# Patient Record
Sex: Female | Born: 1995
Health system: Southern US, Community
[De-identification: ages and names within clinical notes are randomized; demographics above are authoritative.]

## PROBLEM LIST (undated history)

## (undated) DIAGNOSIS — F329 Major depressive disorder, single episode, unspecified: Secondary | ICD-10-CM

## (undated) DIAGNOSIS — F419 Anxiety disorder, unspecified: Secondary | ICD-10-CM

## (undated) DIAGNOSIS — F32A Depression, unspecified: Secondary | ICD-10-CM

## (undated) DIAGNOSIS — F39 Unspecified mood [affective] disorder: Secondary | ICD-10-CM

## (undated) HISTORY — PX: ABDOMINAL HYSTERECTOMY: SHX81

## (undated) HISTORY — PX: NO PAST SURGERIES: SHX2092

---

## 2015-07-18 ENCOUNTER — Inpatient Hospital Stay (HOSPITAL_COMMUNITY)
Admission: AD | Admit: 2015-07-18 | Discharge: 2015-07-21 | DRG: 885 | Disposition: A | Discharge status: Home / Self Care | Payer: Managed Care, Other (non HMO) | Source: Intra-hospital | Attending: Psychiatry | Admitting: Psychiatry

## 2015-07-18 ENCOUNTER — Encounter (HOSPITAL_COMMUNITY): Payer: Self-pay

## 2015-07-18 ENCOUNTER — Emergency Department (HOSPITAL_BASED_OUTPATIENT_CLINIC_OR_DEPARTMENT_OTHER)
Admission: EM | Admit: 2015-07-18 | Discharge: 2015-07-18 | Disposition: A | Discharge status: Other Acute Inpatient Hospital | Payer: Managed Care, Other (non HMO) | Attending: Emergency Medicine | Admitting: Emergency Medicine

## 2015-07-18 ENCOUNTER — Encounter (HOSPITAL_BASED_OUTPATIENT_CLINIC_OR_DEPARTMENT_OTHER): Payer: Self-pay | Admitting: *Deleted

## 2015-07-18 DIAGNOSIS — Z818 Family history of other mental and behavioral disorders: Secondary | ICD-10-CM | POA: Diagnosis not present

## 2015-07-18 DIAGNOSIS — R45851 Suicidal ideations: Secondary | ICD-10-CM

## 2015-07-18 DIAGNOSIS — F1310 Sedative, hypnotic or anxiolytic abuse, uncomplicated: Secondary | ICD-10-CM | POA: Diagnosis not present

## 2015-07-18 DIAGNOSIS — F419 Anxiety disorder, unspecified: Secondary | ICD-10-CM | POA: Insufficient documentation

## 2015-07-18 DIAGNOSIS — Z79899 Other long term (current) drug therapy: Secondary | ICD-10-CM | POA: Diagnosis not present

## 2015-07-18 DIAGNOSIS — F640 Transsexualism: Secondary | ICD-10-CM | POA: Diagnosis present

## 2015-07-18 DIAGNOSIS — F649 Gender identity disorder, unspecified: Secondary | ICD-10-CM | POA: Diagnosis not present

## 2015-07-18 DIAGNOSIS — R4589 Other symptoms and signs involving emotional state: Secondary | ICD-10-CM

## 2015-07-18 DIAGNOSIS — F411 Generalized anxiety disorder: Secondary | ICD-10-CM | POA: Diagnosis present

## 2015-07-18 DIAGNOSIS — F329 Major depressive disorder, single episode, unspecified: Secondary | ICD-10-CM | POA: Diagnosis not present

## 2015-07-18 DIAGNOSIS — F332 Major depressive disorder, recurrent severe without psychotic features: Principal | ICD-10-CM | POA: Diagnosis present

## 2015-07-18 DIAGNOSIS — G4700 Insomnia, unspecified: Secondary | ICD-10-CM | POA: Diagnosis present

## 2015-07-18 DIAGNOSIS — F32A Depression, unspecified: Secondary | ICD-10-CM | POA: Diagnosis present

## 2015-07-18 HISTORY — DX: Unspecified mood (affective) disorder: F39

## 2015-07-18 HISTORY — DX: Anxiety disorder, unspecified: F41.9

## 2015-07-18 HISTORY — DX: Major depressive disorder, single episode, unspecified: F32.9

## 2015-07-18 HISTORY — DX: Depression, unspecified: F32.A

## 2015-07-18 LAB — COMPREHENSIVE METABOLIC PANEL
ALT: 10 U/L — AB (ref 14–54)
ANION GAP: 5 (ref 5–15)
AST: 18 U/L (ref 15–41)
Albumin: 4.5 g/dL (ref 3.5–5.0)
Alkaline Phosphatase: 65 U/L (ref 38–126)
BUN: 13 mg/dL (ref 6–20)
CHLORIDE: 110 mmol/L (ref 101–111)
CO2: 27 mmol/L (ref 22–32)
CREATININE: 0.74 mg/dL (ref 0.44–1.00)
Calcium: 9.1 mg/dL (ref 8.9–10.3)
Glucose, Bld: 89 mg/dL (ref 65–99)
Potassium: 4.2 mmol/L (ref 3.5–5.1)
Sodium: 142 mmol/L (ref 135–145)
Total Bilirubin: 0.2 mg/dL — ABNORMAL LOW (ref 0.3–1.2)
Total Protein: 7 g/dL (ref 6.5–8.1)

## 2015-07-18 LAB — ETHANOL: Alcohol, Ethyl (B): <5 mg/dL (ref <5)

## 2015-07-18 LAB — CBC WITH DIFFERENTIAL/PLATELET
Basophils Absolute: 0 10*3/uL (ref 0.0–0.1)
Basophils Relative: 0 %
EOS PCT: 3 %
Eosinophils Absolute: 0.1 10*3/uL (ref 0.0–0.7)
HCT: 37.3 % (ref 36.0–46.0)
Hemoglobin: 12.1 g/dL (ref 12.0–15.0)
LYMPHS ABS: 1.2 10*3/uL (ref 0.7–4.0)
LYMPHS PCT: 43 %
MCH: 27.3 pg (ref 26.0–34.0)
MCHC: 32.4 g/dL (ref 30.0–36.0)
MCV: 84.2 fL (ref 78.0–100.0)
MONO ABS: 0.3 10*3/uL (ref 0.1–1.0)
MONOS PCT: 10 %
Neutro Abs: 1.2 10*3/uL — ABNORMAL LOW (ref 1.7–7.7)
Neutrophils Relative %: 44 %
PLATELETS: 224 10*3/uL (ref 150–400)
RBC: 4.43 MIL/uL (ref 3.87–5.11)
RDW: 14 % (ref 11.5–15.5)
WBC: 2.7 10*3/uL — ABNORMAL LOW (ref 4.0–10.5)

## 2015-07-18 LAB — RAPID URINE DRUG SCREEN, HOSP PERFORMED
AMPHETAMINES: NOT DETECTED
BENZODIAZEPINES: POSITIVE — AB
Barbiturates: NOT DETECTED
Cocaine: NOT DETECTED
OPIATES: NOT DETECTED
TETRAHYDROCANNABINOL: NOT DETECTED

## 2015-07-18 LAB — URINALYSIS, ROUTINE W REFLEX MICROSCOPIC
Bilirubin Urine: NEGATIVE
GLUCOSE, UA: NEGATIVE mg/dL
Hgb urine dipstick: NEGATIVE
Ketones, ur: NEGATIVE mg/dL
LEUKOCYTES UA: NEGATIVE
Nitrite: NEGATIVE
PROTEIN: NEGATIVE mg/dL
SPECIFIC GRAVITY, URINE: 1.019 (ref 1.005–1.030)
pH: 6.5 (ref 5.0–8.0)

## 2015-07-18 LAB — SALICYLATE LEVEL: Salicylate Lvl: <4 mg/dL (ref 2.8–30.0)

## 2015-07-18 MED ORDER — BUPROPION HCL ER (SMOKING DET) 150 MG PO TB12
150.0000 mg | ORAL_TABLET | Freq: Every day | ORAL | Status: DC
  Filled 2015-07-18: qty 1

## 2015-07-18 MED ORDER — MAGNESIUM HYDROXIDE 400 MG/5ML PO SUSP
30.0000 mL | Freq: Every day | ORAL | Status: DC | PRN
  Administered 2015-07-21: 30 mL via ORAL
  Filled 2015-07-18: qty 30

## 2015-07-18 MED ORDER — ACETAMINOPHEN 325 MG PO TABS: 650.0000 mg | ORAL_TABLET | Freq: Four times a day (QID) | ORAL | Status: DC | PRN

## 2015-07-18 MED ORDER — RISPERIDONE 0.5 MG PO TABS: 0.5000 mg | ORAL_TABLET | Freq: Every day | ORAL | Status: DC

## 2015-07-18 MED ORDER — ALUM & MAG HYDROXIDE-SIMETH 200-200-20 MG/5ML PO SUSP
30.0000 mL | ORAL | Status: DC | PRN
  Administered 2015-07-20: 30 mL via ORAL
  Filled 2015-07-18: qty 30

## 2015-07-18 MED ORDER — BUPROPION HCL ER (XL) 150 MG PO TB24
150.0000 mg | ORAL_TABLET | Freq: Every day | ORAL | Status: DC
  Administered 2015-07-19: 150 mg via ORAL
  Filled 2015-07-18 (×4): qty 1

## 2015-07-18 MED ORDER — HYDROXYZINE HCL 25 MG PO TABS
25.0000 mg | ORAL_TABLET | Freq: Four times a day (QID) | ORAL | Status: DC | PRN
  Administered 2015-07-19 – 2015-07-20 (×2): 25 mg via ORAL
  Filled 2015-07-18 (×2): qty 1

## 2015-07-18 MED ORDER — CLONAZEPAM 0.5 MG PO TABS
0.5000 mg | ORAL_TABLET | Freq: Two times a day (BID) | ORAL | Status: DC | PRN
  Filled 2015-07-18: qty 1

## 2015-07-18 MED ORDER — RISPERIDONE 1 MG PO TABS
1.0000 mg | ORAL_TABLET | Freq: Every day | ORAL | Status: DC
  Administered 2015-07-18 – 2015-07-20 (×3): 1 mg via ORAL
  Filled 2015-07-18 (×7): qty 1

## 2015-07-18 NOTE — ED Notes (Signed)
Sitter called for from staffing office, patient in scrubs and belongings checked by security.

## 2015-07-18 NOTE — ED Notes (Signed)
Pt admits to SI earlier today. Plan was to take pills. Pt calm and cooperative with care. Pt identifies as a female and goes by the name Laycee.

## 2015-07-18 NOTE — Progress Notes (Signed)
Adult Psychoeducational Group Note  Date:  07/18/2015 Time:  9:29 PM  Group Topic/Focus:  Wrap-Up Group:   The focus of this group is to help patients review their daily goal of treatment and discuss progress on daily workbooks.  Participation Level:  Minimal  Participation Quality:  Appropriate  Affect:  Flat  Cognitive:  Appropriate  Insight: Appropriate  Engagement in Group:  Limited  Modes of Intervention:  Socialization and Support  Additional Comments:  Patient attended and participated in group tonight. She reports that people was nice to her today. She advised that she consider herself a creative person.  Lita Mains [facility] 07/18/2015, 9:29 PM

## 2015-07-18 NOTE — ED Provider Notes (Signed)
CSN: [id number]     Arrival date & time 07/18/15  1453 History   First MD Initiated Contact with Patient 07/18/15 1526     Chief Complaint  Patient presents with  . Suicidal     (Consider location/radiation/quality/duration/timing/severity/associated sxs/prior Treatment) HPI Patient is a 20 year old transgender female who presents with increasing suicidal ideation. He has a history of depression and anxiety for which he takes Zyban, Klonopin and Risperdal. States has been compliant with medications though occasionally takes increased doses of Klonopin for anxiety reduction. Has no previous suicidal attempts. Patient does have a history of cutting anxiety relief. Patient states he has not been able to see his mental health professional in 2 months due to insurance changes. He has had increasing thoughts of harming himself by "taking pills". Denies using illegal drugs or drinking alcohol. He also denies any auditory or visual hallucinations. No previous inpatient psychiatric treatment. Denies any physical complaints at this time. Past Medical History  Diagnosis Date  . Mood disorder (HCC)   . Anxiety    History reviewed. No pertinent past surgical history. No family history on file. Social History  Substance Use Topics  . Smoking status: Never Smoker   . Smokeless tobacco: Never Used  . Alcohol Use: No     Comment: Rare   OB History    No data available     Review of Systems  Constitutional: Negative for fever and chills.  Respiratory: Negative for shortness of breath.   Cardiovascular: Negative for chest pain.  Gastrointestinal: Negative for nausea, vomiting and abdominal pain.  Musculoskeletal: Negative for back pain and neck pain.  Skin: Positive for wound. Negative for rash.  Neurological: Negative for weakness, numbness and headaches.  Psychiatric/Behavioral: Positive for suicidal ideas, self-injury and dysphoric mood. Negative for hallucinations. The patient is  nervous/anxious.   All other systems reviewed and are negative.     Allergies  Review of patient's allergies indicates no known allergies.  Home Medications   Prior to Admission medications   Medication Sig Start Date End Date Taking? Authorizing Provider  buPROPion (ZYBAN) 150 MG 12 hr tablet Take 150 mg by mouth daily.   Yes Historical Provider, MD  clonazePAM (KLONOPIN) 0.5 MG tablet Take 0.5 mg by mouth 2 (two) times daily as needed for anxiety.   Yes Historical Provider, MD  risperiDONE (RISPERDAL) 0.5 MG tablet Take 0.5 mg by mouth at bedtime.   Yes Historical Provider, MD   LMP 07/04/2015 (Approximate) Physical Exam  Constitutional: She is oriented to person, place, and time. She appears well-developed and well-nourished. No distress.  HENT:  Head: Normocephalic and atraumatic.  Mouth/Throat: Oropharynx is clear and moist.  Eyes: EOM are normal. Pupils are equal, round, and reactive to light.  Neck: Normal range of motion. Neck supple.  Cardiovascular: Normal rate and regular rhythm.  Exam reveals no gallop and no friction rub.   No murmur heard. Pulmonary/Chest: Effort normal and breath sounds normal. No respiratory distress. She has no wheezes. She has no rales.  Abdominal: Soft. Bowel sounds are normal. She exhibits no distension and no mass. There is no tenderness. There is no rebound and no guarding.  Musculoskeletal: Normal range of motion. She exhibits no edema or tenderness.  Neurological: She is alert and oriented to person, place, and time.  Moves all extremities without deficit. Sensation is fully intact.  Skin: Skin is warm and dry. No rash noted. No erythema.  Multiple healing transverse scars on bilateral upper extremities consistent with previous  cutting. No acute injuries  Psychiatric:  Patient with dysphoric mood and poor eye contact. Good insight. Admits to suicidal ideation and possible plan for intentional overdose. No homicidal ideation.  Nursing note  and vitals reviewed.   ED Course  Procedures (including critical care time) Labs Review Labs Reviewed  CBC WITH DIFFERENTIAL/PLATELET - Abnormal; Notable for the following:    WBC 2.7 (*)    Neutro Abs 1.2 (*)    All other components within normal limits  COMPREHENSIVE METABOLIC PANEL - Abnormal; Notable for the following:    ALT 10 (*)    Total Bilirubin 0.2 (*)    All other components within normal limits  URINE RAPID DRUG SCREEN, HOSP PERFORMED - Abnormal; Notable for the following:    Benzodiazepines POSITIVE (*)    All other components within normal limits  URINALYSIS, ROUTINE W REFLEX MICROSCOPIC (NOT AT [facility])  ETHANOL  SALICYLATE LEVEL    Imaging Review No results found. I have personally reviewed and evaluated these images and lab results as part of my medical decision-making.   EKG Interpretation None      MDM   Final diagnoses:  Suicidal ideation  Depressed mood   Medically cleared for evaluation by TTS.      Loren Racer, MD 07/18/15 [phone]

## 2015-07-18 NOTE — BHH Counselor (Signed)
Per [facility]ely[facility] at [facility], pt has been accepted to bed 405-1. Writer spoke w/ Art therapist at [facility] and Asher Muir will have pt sign voluntary paperwork. Pt can be transferred before 7 pm.  Evette Cristal, [place] Therapeutic Triage Specialist

## 2015-07-18 NOTE — BH Assessment (Addendum)
Tele Assessment Note   Margaret Marshall is an 20 y.o. female. She identifies as female and goes by "Margaret Marshall". Pt reports SI with earlier plan today to overdose on pills. He states that he told a sibling and his mom of her SI and they became worried. Pt sts he used to see psychiatrist Len Blalock, but he hasn't been on meds for 2 mos due to insurance changes. He denies HI and denies [facility]. No delusions are noted. Pt reports mood which is "a little anxious" and "kind of depressed". Pt rates  anxiety as 6 on a scale of 0 to 10 (10 being most anxious). He denies substance abuse. Pt denies hx of suicide attempts. He reports a hx of self harm and last cut himself two mos ago. He reports his maternal grandfather committed suicide. Pt says that his father has bipolar d/o and ADHH and he reports his siblings have depression and anxiety. He endorses hopelessness, worthlessness, faitgue, and loss of interest in usual pleasures. Current stressors include thinking that his MI will never go away. Another stressor is feeling like he won't be able to afford the physical and MH treatment to transition from female to female. He says he worries about finding a job and states he wants to move. He says he works part time at [employer]. He says he sometimes has obsessive thoughts about stressful news stories. When asked whether upon discharge she would be able not to kill herself accidentally or on purpose, pt says, "I don't know."  Diagnosis:  Major Depressive Disorder, Recurrent, Moderate Generalized Anxiety Disorder  Past Medical History:  Past Medical History  Diagnosis Date  . Mood disorder (HCC)   . Anxiety     History reviewed. No pertinent past surgical history.  Family History: No family history on file.  Social History:  reports that she has never smoked. She has never used smokeless tobacco. She reports that she does not drink alcohol or use illicit drugs.  Additional Social History:  Alcohol / Drug Use Pain  Medications: pt denies abuse Prescriptions: pt denies abuse Over the Counter: pt denies abuse History of alcohol / drug use?: No history of alcohol / drug abuse Longest period of sobriety (when/how long): n/a  CIWA: CIWA-Ar BP: 111/68 mmHg Pulse Rate: 70 COWS:    PATIENT STRENGTHS: (choose at least two) Ability for insight Average or above average intelligence Communication skills General fund of knowledge Physical Health Supportive family/friends Work skills  Allergies: No Known Allergies  Home Medications:  (Not in a hospital admission)  OB/GYN Status:  Patient's last menstrual period was 07/04/2015 (approximate).  General Assessment Data Location of Assessment:  ([facility]) TTS Assessment: In system Is this a Tele or Face-to-Face Assessment?: Tele Assessment Is this an Initial Assessment or a Re-assessment for this encounter?: Initial Assessment Marital status: Single Living Arrangements: Parent (mom) Can pt return to current living arrangement?: Yes Admission Status: Voluntary Is patient capable of signing voluntary admission?: Yes Referral Source: Self/Family/Friend Insurance type: none listed (pt states she now has Community education officer)     Crisis Care Plan Living Arrangements: Parent (mom) Name of Psychiatrist: none Name of Therapist: none  Education Status Is patient currently in school?: No Highest grade of school patient has completed: 66 Name of school: [employer]  Risk to self with the past 6 months Suicidal Ideation: Yes-Currently Present Has patient been a risk to self within the past 6 months prior to admission? : No Is patient at risk for suicide?: Yes Suicidal  Plan?: Yes-Currently Present Has patient had any suicidal plan within the past 6 months prior to admission? : No Specify Current Suicidal Plan: to overdose on pills Access to Means: Yes Specify Access to Suicidal Means: access to pills What has been your use of drugs/alcohol within the last 12 months?:  none Previous Attempts/Gestures: No How many times?: 0 Other Self Harm Risks: none Triggers for Past Attempts:  (n/a) Intentional Self Injurious Behavior: Cutting Comment - Self Injurious Behavior: pt states she last cut two weeks ago Family Suicide History: Yes (maternal grandfather committed suicide) Recent stressful life event(s): Other (Comment), Financial Problems (worried about future, moving from [place], transitioning) Persecutory voices/beliefs?: No Depression: Yes Depression Symptoms: Feeling worthless/self pity, Loss of interest in usual pleasures, Fatigue, Despondent Substance abuse history and/or treatment for substance abuse?: No Suicide prevention information given to non-admitted patients: Not applicable  Risk to Others within the past 6 months Homicidal Ideation: No Does patient have any lifetime risk of violence toward others beyond the six months prior to admission? : No Thoughts of Harm to Others: No Current Homicidal Intent: No Current Homicidal Plan: No Access to Homicidal Means: No Identified Victim: none History of harm to others?: No Assessment of Violence: None Noted Violent Behavior Description: pt denies hx violence - is calm Does patient have access to weapons?: No Criminal Charges Pending?: No Does patient have a court date: No Is patient on probation?: No  Psychosis Hallucinations: None noted Delusions: None noted  Mental Status Report Appearance/Hygiene: Unremarkable Eye Contact: Good Motor Activity: Freedom of movement Speech: Logical/coherent, Soft Level of Consciousness: Quiet/awake, Alert Mood: Depressed, Anxious, Sad Affect: Appropriate to circumstance, Sad, Depressed Anxiety Level: Moderate Thought Processes: Relevant, Coherent Judgement: Unimpaired Orientation: Person, Place, Time, Situation Obsessive Compulsive Thoughts/Behaviors: None  Cognitive Functioning Concentration: Normal Memory: Recent Intact, Remote Intact IQ:  Average Insight: Good Impulse Control: Fair Appetite: Good Sleep: No Change Total Hours of Sleep: 10  ADLScreening [facility] Assessment Services) Patient's cognitive ability adequate to safely complete daily activities?: Yes Patient able to express need for assistance with ADLs?: Yes Independently performs ADLs?: Yes (appropriate for developmental age)  Prior Inpatient Therapy Prior Inpatient Therapy: No Prior Therapy Dates: na Prior Therapy Facilty/Provider(s): na Reason for Treatment: na  Prior Outpatient Therapy Prior Outpatient Therapy: Yes Prior Therapy Dates: until two mos ago Prior Therapy Facilty/Provider(s): Dr Len Blalock Reason for Treatment: depression, anxiety Does patient have an ACCT team?: No Does patient have Intensive In-House Services?  : No Does patient have [facility] services? : No Does patient have P4CC services?: No  ADL Screening (condition at time of admission) Patient's cognitive ability adequate to safely complete daily activities?: Yes Is the patient deaf or have difficulty hearing?: No Does the patient have difficulty seeing, even when wearing glasses/contacts?: No Does the patient have difficulty concentrating, remembering, or making decisions?: No Patient able to express need for assistance with ADLs?: Yes Does the patient have difficulty dressing or bathing?: No Independently performs ADLs?: Yes (appropriate for developmental age) Does the patient have difficulty walking or climbing stairs?: No Weakness of Legs: None Weakness of Arms/Hands: None  Home Assistive Devices/Equipment Home Assistive Devices/Equipment: Eyeglasses    Abuse/Neglect Assessment (Assessment to be complete while patient is alone) Physical Abuse: Denies Verbal Abuse: Denies Sexual Abuse: Denies Exploitation of patient/patient's resources: Denies Self-Neglect: Denies     Merchant navy officer (For Healthcare) Does patient have an advance directive?: No Would patient like  information on creating an advanced directive?: No - patient declined information  Additional Information 1:1 In Past 12 Months?: No CIRT Risk: No Elopement Risk: No Does patient have medical clearance?: No     Disposition:  Disposition Initial Assessment Completed for this Encounter: Yes Disposition of Patient: Inpatient treatment program Type of inpatient treatment program: Adult ([facility] with[facility] NP accepts to 405-1 pending medical clearance)  Betzalel Umbarger P 07/18/2015 5:59 PM

## 2015-07-18 NOTE — ED Notes (Signed)
Report called to Patty at [facility].

## 2015-07-18 NOTE — ED Notes (Signed)
Pt left via pellum . Pt's mother has all personal items.

## 2015-07-18 NOTE — ED Notes (Signed)
Going to [facility] 405-1

## 2015-07-18 NOTE — Tx Team (Signed)
Initial Interdisciplinary Treatment Plan   PATIENT STRESSORS: Medication change or noncompliance   PATIENT STRENGTHS: Ability for insight Average or above average intelligence Communication skills General fund of knowledge Supportive family/friends   PROBLEM LIST: Problem List/Patient Goals Date to be addressed Date deferred Reason deferred Estimated date of resolution  "Better way to deal with anxiety."      "Work on my depression."                                                 DISCHARGE CRITERIA:  Improved stabilization in mood, thinking, and/or behavior Need for constant or close observation no longer present  PRELIMINARY DISCHARGE PLAN: Outpatient therapy Return to previous living arrangement  PATIENT/FAMIILY INVOLVEMENT: This treatment plan has been presented to and reviewed with the patient, Margaret Marshall.  The patient and family have been given the opportunity to ask questions and make suggestions.  Wandra Mannan 07/18/2015, 9:00 PM

## 2015-07-18 NOTE — ED Notes (Signed)
[facility] called to transport to [facility], ETA 20 minutes

## 2015-07-18 NOTE — Progress Notes (Signed)
Patient alert and oriented x 4. Patient denies pain/SI/HI/AVH. Patient states, "I was feeling very depressed and talked to my brother that is how I got here." Patient cooperative with assessment. Patient signed consent paperwork. Patient states she is transgender and is female to female and wants to be called "Margaret Marshall".  Patient's mother visited with patient during visitation hours. This Clinical research associate advised patient's mother on policy on clothing and belongings that can be brought to unit for patient. Patient was oriented to unit and attended evening group.

## 2015-07-18 NOTE — Progress Notes (Signed)
Patient has bandaid on R heel.  Old cut marks on L upper and lower arm, healed.

## 2015-07-19 ENCOUNTER — Encounter (HOSPITAL_COMMUNITY): Payer: Self-pay | Admitting: Psychiatry

## 2015-07-19 DIAGNOSIS — R45851 Suicidal ideations: Secondary | ICD-10-CM

## 2015-07-19 DIAGNOSIS — F640 Transsexualism: Secondary | ICD-10-CM | POA: Diagnosis present

## 2015-07-19 DIAGNOSIS — F649 Gender identity disorder, unspecified: Secondary | ICD-10-CM

## 2015-07-19 LAB — T4, FREE: FREE T4: 1.05 ng/dL (ref 0.61–1.12)

## 2015-07-19 LAB — LIPID PANEL
CHOL/HDL RATIO: 3.6 ratio
CHOLESTEROL: 183 mg/dL (ref 0–200)
HDL: 51 mg/dL (ref >40)
LDL Cholesterol: 103 mg/dL — ABNORMAL HIGH (ref 0–99)
Triglycerides: 143 mg/dL (ref <150)
VLDL: 29 mg/dL (ref 0–40)

## 2015-07-19 LAB — TSH: TSH: 10.554 u[IU]/mL — ABNORMAL HIGH (ref 0.350–4.500)

## 2015-07-19 MED ORDER — BUPROPION HCL ER (XL) 300 MG PO TB24: 300.0000 mg | ORAL_TABLET | Freq: Every day | ORAL | Status: DC

## 2015-07-19 MED ORDER — BUPROPION HCL ER (XL) 150 MG PO TB24
150.0000 mg | ORAL_TABLET | Freq: Every day | ORAL | Status: DC
  Administered 2015-07-20 – 2015-07-21 (×2): 150 mg via ORAL
  Filled 2015-07-19 (×4): qty 1

## 2015-07-19 MED ORDER — TRAZODONE HCL 50 MG PO TABS
50.0000 mg | ORAL_TABLET | ORAL | Status: DC | PRN
  Administered 2015-07-20: 50 mg via ORAL
  Filled 2015-07-19: qty 1

## 2015-07-19 MED ORDER — CLONAZEPAM 0.5 MG PO TABS
0.5000 mg | ORAL_TABLET | Freq: Two times a day (BID) | ORAL | Status: DC | PRN
  Administered 2015-07-20: 0.5 mg via ORAL
  Filled 2015-07-19: qty 1

## 2015-07-19 NOTE — Tx Team (Signed)
Interdisciplinary Treatment Plan Update (Adult) Date: 07/19/2015   Date: 07/19/2015 10:23 AM  Progress in Treatment:  Attending groups: Yes  Participating in groups: Yes  Taking medication as prescribed: Yes  Tolerating medication: Yes  Family/Significant othe contact made: No, CSW assessing for appropriate contact Patient understands diagnosis: Yes AEB seeking help with depression  Discussing patient identified problems/goals with staff: Yes  Medical problems stabilized or resolved: Yes  Denies suicidal/homicidal ideation: Yes Patient has not harmed self or Others: Yes   New problem(s) identified: None identified at this time.   Discharge Plan or Barriers: Pt will discharge home and follow-up with outpatient resources.  Additional comments:  Patient and CSW reviewed pt's identified goals and treatment plan. Patient verbalized understanding and agreed to treatment plan. CSW reviewed [facility] "Discharge Process and Patient Involvement" Form. Pt verbalized understanding of information provided and signed form.   Reason for Continuation of Hospitalization:  Anxiety Depression Medication stabilization Suicidal ideation  Estimated length of stay: 2-3 days  Review of initial/current patient goals per problem list:   1.  Goal(s): Patient will participate in aftercare plan  Met:  Yes  Target date: 3-5 days from date of admission   As evidenced by: Patient will participate within aftercare plan AEB aftercare provider and housing plan at discharge being identified.   07/19/15: Pt will return home and follow-up with outpatient services  2.  Goal (s): Patient will exhibit decreased depressive symptoms and suicidal ideations.  Met:  Yes  Target date: 3-5 days from date of admission   As evidenced by: Patient will utilize self rating of depression at 3 or below and demonstrate decreased signs of depression or be deemed stable for discharge by MD.  07/19/15: Pt rates depression at 2/10;  denies SI  3.  Goal(s): Patient will demonstrate decreased signs and symptoms of anxiety.  Met:  Yes  Target date: 3-5 days from date of admission   As evidenced by: Patient will utilize self rating of anxiety at 3 or below and demonstrated decreased signs of anxiety, or be deemed stable for discharge by MD  07/19/15: Pt rates anxiety at 2/10  Attendees:  Patient:    Family:    Physician: Dr. Sabra Heck, MD; Dr. Shea Evans, MD  07/19/2015 10:23 AM  Nursing: Lars Pinks, RN Case manager  07/19/2015 10:23 AM  Clinical Social Worker Peri Maris, [place] 07/19/2015 10:23 AM  Other: Tilden Fossa, [place] 07/19/2015 10:23 AM  Clinical: Grayland Ormond, RN 07/19/2015 10:23 AM  Other: , RN Charge Nurse 07/19/2015 10:23 AM  Other:     Peri Maris, [place] Work [phone]

## 2015-07-19 NOTE — B[facility] Suicide Risk Assessment (Signed)
[facility] ([facility]) Admission Suicide Risk Assessment   Nursing information obtained from:  Patient Demographic factors:  Adolescent or young adult, Caucasian, Gay, lesbian, or bisexual orientation Current Mental Status:  NA Loss Factors:  NA Historical Factors:  Family history of mental illness or substance abuse Risk Reduction Factors:  Sense of responsibility to family  Total Time spent with patient: 30 minutes Principal Problem: MDD (major depressive disorder), recurrent severe, without psychosis (HCC) Diagnosis:   Patient Active Problem List   Diagnosis Date Noted  . Gender dysphoria [F64.9] 07/19/2015  . MDD (major depressive disorder), recurrent severe, without psychosis (HCC) [F33.2] 07/18/2015   Subjective Data:Pt reports feeling depressed ,having relational issues with mother , and confiding in her brother about having SI. Pt today continues to be depressed , but denies SI. Pt does report one episode of questionable hypomanic sx - which lasted for 2 days or so - however when writer asked her specific questions she did not meet criteria for manic sx. Pt denied AH/VH /sleep issues.  Continued Clinical Symptoms:  Alcohol Use Disorder Identification Test Final Score (AUDIT): 0 The "Alcohol Use Disorders Identification Test", Guidelines for Use in Primary Care, Second Edition.  World Science writer [facility]). Score between 0-7:  no or low risk or alcohol related problems. Score between 8-15:  moderate risk of alcohol related problems. Score between 16-19:  high risk of alcohol related problems. Score 20 or above:  warrants further diagnostic evaluation for alcohol dependence and treatment.   CLINICAL FACTORS:   Depression:   Hopelessness Impulsivity   Musculoskeletal: Strength & Muscle Tone: within normal limits Gait & Station: normal Patient leans: N/A  Psychiatric Specialty Exam: Review of Systems  Psychiatric/Behavioral: Positive for depression and hallucinations.  All other  systems reviewed and are negative.   Blood pressure 125/67, pulse 86, temperature 97.8 F (36.6 C), temperature source Oral, resp. rate 16, height 5' 6.5" (1.689 m), weight 55.792 kg (123 lb), last menstrual period 07/04/2015, SpO2 100 %.Body mass index is 19.56 kg/(m^2).  General Appearance: Casual  Eye Contact::  Fair  Speech:  Normal Rate  Volume:  Normal  Mood:  Depressed  Affect:  Depressed  Thought Process:  Coherent  Orientation:  Full (Time, Place, and Person)  Thought Content:  Rumination  Suicidal Thoughts:  No  Homicidal Thoughts:  No  Memory:  Immediate;   Fair Recent;   Fair Remote;   Fair  Judgement:  Fair  Insight:  Fair  Psychomotor Activity:  Normal  Concentration:  Fair  Recall:  [employer] of Knowledge:Fair  Language: Fair  Akathisia:  No  Handed:  Right  AIMS (if indicated):     Assets:  Desire for Improvement Housing Physical Health Social Support Transportation Vocational/Educational  Sleep:  Number of Hours: 6.25  Cognition: WNL  ADL's:  Intact    COGNITIVE FEATURES THAT CONTRIBUTE TO RISK:  Polarized thinking    SUICIDE RISK:   Moderate:  Frequent suicidal ideation with limited intensity, and duration, some specificity in terms of plans, no associated intent, good self-control, limited dysphoria/symptomatology, some risk factors present, and identifiable protective factors, including available and accessible social support.  PLAN OF CARE: Patient will benefit from inpatient treatment and stabilization.  Estimated length of stay is 5-7 days.  Reviewed past medical records,treatment plan.  Will continue Wellbutrin XL to 150  mg po daily. However , will be cautious about ?hypomanic sx- pt felt she might have had an episode of two days of hypomania a long time  ago - however was never officially diagnosed. Pt was on Klonopin per ehr notes - UDS positive for BZD. Will restart home dose. Pt although did not report any anxiety sx to Clinical research associate. Will  continue Risperidone 1 mg po qhs to augment the effect of Wellbutrin. Will continue Trazodone 50 mg po qhs prn for sleep. Will continue to monitor vitals ,medication compliance and treatment side effects while patient is here.  Will monitor for medical issues as well as call consult as needed.  Reviewed labs ,will order EKG for qtc- pending Hba1c, PL. TSH - elevated - but  Free T4 wnl. CSW will start working on disposition.  Patient to participate in therapeutic milieu .       I certify that inpatient services furnished can reasonably be expected to improve the patient's condition.   Lanae Federer, MD 07/19/2015, 3:46 PM
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
## 2015-07-19 NOTE — Progress Notes (Signed)
EKG completed and placed in patient's chart. Patient stated he prefers to be called "Margaret Marshall".

## 2015-07-19 NOTE — H&P (Signed)
Psychiatric Admission Assessment Adult  Patient Identification: Margaret Marshall MRN:  [id number] Date of Evaluation:  07/19/2015 Chief Complaint:  Anxiety Principal Diagnosis: MDD (major depressive disorder), recurrent severe, without psychosis (HCC) Diagnosis:   Patient Active Problem List   Diagnosis Date Noted  . MDD (major depressive disorder), recurrent severe, without psychosis (HCC) [F33.2] 07/18/2015    Priority: Medium   History of Present Illness: Margaret Marshall 20 y.o is awake, alert and oriented X3 , patient is pleasant, clam, cooperative and guarded. Reports she was frustrated with her mother on Sunday night and threaten to kill her self. Patient reports a pass history of cutting to help deal with her anxiety. Reports she has been depressed for the past 3 years. Reports she is prescribe medications for her depression and feel like they help most of the time. States her depression 8/10 day.  Denies suicidal or homicidal ideation today. Denies auditory or visual hallucination and does not appear to be responding to internal stimuli.  Reports this is her first inpatient hospitalization and is ready to be discharge. Patient report a good appetite and states she is resting well throughout the night. Patient reports she is employed at the [employer] and has a good support system. Support, encouragement and reassurance was provided.   Associated Signs/Symptoms: Depression Symptoms:  depressed mood, suicidal thoughts without plan, anxiety, (Hypo) Manic Symptoms:  Irritable Mood, Anxiety Symptoms:  Excessive Worry, Psychotic Symptoms:  Hallucinations: None PTSD Symptoms: Avoidance:  Decreased Interest/Participation Total Time spent with patient: 45 minutes  Past Psychiatric History: Depression  Risk to Self: Is patient at risk for suicide?: Yes Risk to Others:   Prior Inpatient Therapy:   Prior Outpatient Therapy:    Alcohol Screening: Patient refused Alcohol Screening Tool: Yes 1. How often  do you have a drink containing alcohol?: Never 9. Have you or someone else been injured as a result of your drinking?: No 10. Has a relative or friend or a doctor or another health worker been concerned about your drinking or suggested you cut down?: No Alcohol Use Disorder Identification Test Final Score (AUDIT): 0 Brief Intervention: AUDIT score less than 7 or less-screening does not suggest unhealthy drinking-brief intervention not indicated Substance Abuse History in the last 12 months:  Yes.   Consequences of Substance Abuse: NA Previous Psychotropic Medications: YES Psychological Evaluations: YES Past Medical History:  Past Medical History  Diagnosis Date  . Mood disorder (HCC)   . Anxiety   . Depression    History reviewed. No pertinent past surgical history. Family History: History reviewed. No pertinent family history. Family Psychiatric  History: Father; Bipolar, Mother: depression Social History:  History  Alcohol Use No    Comment: Rare     History  Drug Use No    Social History   Social History  . Marital Status: Single    Spouse Name: N/A  . Number of Children: N/A  . Years of Education: N/A   Social History Main Topics  . Smoking status: Never Smoker   . Smokeless tobacco: Never Used  . Alcohol Use: No     Comment: Rare  . Drug Use: No  . Sexual Activity: Not Currently    Birth Control/ Protection: None   Other Topics Concern  . None   Social History Narrative   Additional Social History:    Pain Medications: pt denies abuse Prescriptions: pt denies abuse Over the Counter: pt denies abuse History of alcohol / drug use?: No history of alcohol / drug  abuse Longest period of sobriety (when/how long): n/a                    Allergies:  No Known Allergies Lab Results:  Results for orders placed or performed during the hospital encounter of 07/18/15 (from the past 48 hour(s))  TSH     Status: Abnormal   Collection Time: 07/19/15  6:40 AM   Result Value Ref Range   TSH 10.554 (H) 0.350 - 4.500 uIU/mL    Comment: Performed at [facility]  Lipid panel     Status: Abnormal   Collection Time: 07/19/15  6:40 AM  Result Value Ref Range   Cholesterol 183 0 - 200 mg/dL   Triglycerides 161 <096 mg/dL   HDL 51 >04 mg/dL   Total CHOL/HDL Ratio 3.6 RATIO   VLDL 29 0 - 40 mg/dL   LDL Cholesterol 540 (H) 0 - 99 mg/dL    Comment:        Total Cholesterol/HDL:CHD Risk Coronary Heart Disease Risk Table                     Men   Women  1/2 Average Risk   3.4   3.3  Average Risk       5.0   4.4  2 X Average Risk   9.6   7.1  3 X Average Risk  23.4   11.0        Use the calculated Patient Ratio above and the CHD Risk Table to determine the patient's CHD Risk.        ATP III CLASSIFICATION (LDL):  <100     mg/dL   Optimal  981-191  mg/dL   Near or Above                    Optimal  130-159  mg/dL   Borderline  478-295  mg/dL   High  >621     mg/dL   Very High Performed at [facility]     Metabolic Disorder Labs:  No results found for: HGBA1C, MPG No results found for: PROLACTIN Lab Results  Component Value Date   CHOL 183 07/19/2015   TRIG 143 07/19/2015   HDL 51 07/19/2015   CHOLHDL 3.6 07/19/2015   VLDL 29 07/19/2015   LDLCALC 103* 07/19/2015    Current Medications: Current Facility-Administered Medications  Medication Dose Route Frequency Provider Last Rate Last Dose  . acetaminophen (TYLENOL) tablet 650 mg  650 mg Oral Q6H PRN Beau Fanny, FNP      . alum & mag hydroxide-simeth (MAALOX/MYLANTA) 200-200-20 MG/5ML suspension 30 mL  30 mL Oral Q4H PRN Beau Fanny, FNP      . buPROPion (WELLBUTRIN XL) 24 hr tablet 150 mg  150 mg Oral Daily Beau Fanny, FNP   150 mg at 07/19/15 0750  . hydrOXYzine (ATARAX/VISTARIL) tablet 25 mg  25 mg Oral Q6H PRN Beau Fanny, FNP   25 mg at 07/19/15 0749  . magnesium hydroxide (MILK OF MAGNESIA) suspension 30 mL  30 mL Oral Daily PRN Beau Fanny, FNP      . risperiDONE (RISPERDAL) tablet 1 mg  1 mg Oral QHS Beau Fanny, FNP   1 mg at 07/18/15 2145   PTA Medications: Prescriptions prior to admission  Medication Sig Dispense Refill Last Dose  . buPROPion (WELLBUTRIN XL) 150 MG 24 hr tablet Take 150 mg by mouth daily.  Past Week at Unknown time  . clonazePAM (KLONOPIN) 0.5 MG tablet Take 0.5 mg by mouth 2 (two) times daily as needed for anxiety.   Past Week at Unknown time  . risperiDONE (RISPERDAL) 0.5 MG tablet Take 0.5 mg by mouth daily.   Past Week at Unknown time    Musculoskeletal: Strength & Muscle Tone: within normal limits Gait & Station: normal Patient leans: N/A  Psychiatric Specialty Exam: Physical Exam  Vitals reviewed. Constitutional: She is oriented to person, place, and time. She appears well-developed and well-nourished.  HENT:  Head: Normocephalic.  Eyes: Pupils are equal, round, and reactive to light.  Neck: Neck supple.  Cardiovascular: Normal rate.   Musculoskeletal: Normal range of motion.  Neurological: She is alert and oriented to person, place, and time.  Skin: Skin is warm.  Psychiatric: She has a normal mood and affect. Her behavior is normal.    Review of Systems  Psychiatric/Behavioral: Positive for depression and suicidal ideas. Negative for hallucinations and substance abuse. The patient is nervous/anxious and has insomnia.   All other systems reviewed and are negative.   Blood pressure 111/77, pulse 113, temperature 97.8 F (36.6 C), temperature source Oral, resp. rate 16, height 5' 6.5" (1.689 m), weight 55.792 kg (123 lb), last menstrual period 07/04/2015, SpO2 100 %.Body mass index is 19.56 kg/(m^2).  General Appearance: Casual  Eye Contact::  Good  Speech:  Clear and Coherent  Volume:  Normal  Mood:  Anxious and Depressed  Affect:  Depressed and Flat  Thought Process:  Linear and Logical  Orientation:  Full (Time, Place, and Person)  Thought Content:  Hallucinations:  None  Suicidal Thoughts:  Yes.  without intent/plan denies at this time  Homicidal Thoughts:  No  Memory:  Immediate;   Fair Recent;   Fair Remote;   Fair  Judgement:  Fair  Insight:  Lacking  Psychomotor Activity:  Restlessness  Concentration:  Fair  Recall:  [employer] of Knowledge:Fair  Language: Fair  Akathisia:  No  Handed:  Right  AIMS (if indicated):     Assets:  Communication Skills Desire for Improvement Social Support  ADL's:  Intact  Cognition: WNL  Sleep:  Number of Hours: 6.25    Treatment Plan Summary:  Daily contact with patient to assess and evaluate symptoms and progress in treatment and Medication management Continue with Wellbutrine 150 mg, and  Risperdal   for mood stabilization. Continue with Trazodone 50 mg PO PRN for insomnia  Will continue to monitor vitals ,medication compliance and treatment side effects while patient is here.  Reviewed labs: TSH 10.55 (elevated), Order T3/T4-Pending results. Consult to Endocrine after lab results.  Consider starting levothyroxine  BAL - , UDS + positive for benzodizpines. CSW will start working on disposition.  Patient to participate in therapeutic milieu    Observation Level/Precautions:  15 minute checks  Laboratory:  CBC Chemistry Profile HbAIC UDS UA- Reviewed   Psychotherapy:  Indiviudal and group session  Medications:  Wellbutrin and resperdal  Consultations:  Psychiatry  Discharge Concerns:  Safety, stabilization, and risk of access to medication and medication stabilization   Estimated LOS:5-7 days  Other:     I certify that inpatient services furnished can reasonably be expected to improve the patient's condition.    Oneta Rack FNP- BC 1/23/201710:03 AM

## 2015-07-19 NOTE — Progress Notes (Signed)
D:  Patient's self inventory sheet, patient sleeps good, no sleep medication given.  Good appetite, normal energy level, good concentration.  Rated depression 2, denied hopeless, anxiety #4.  Denied withdrawals.  Denied SI.  Denied physical problems.  Denied physical pain.  Goal is to work on a plan to manage anxiety, and plan on when to leave.  Plans to talk to people and keep myself occupied.  No discharge plan. A:  Medications administered per MD orders.  Emotional support and encouragement given patient. R:  Denied SI and HI, contracts for safety.  Denied A/V hallucinations.  Safety maintained with 15 minute checks.

## 2015-07-19 NOTE — [facility] Suicide Risk Assessment (Signed)
[facility] INPATIENT:  Family/Significant Other Suicide Prevention Education  Suicide Prevention Education:  Education Completed; Kathe Wirick, Pt's mother [phone],  has been identified by the patient as the family member/significant other with whom the patient will be residing, and identified as the person(s) who will aid the patient in the event of a mental health crisis (suicidal ideations/suicide attempt).  With written consent from the patient, the family member/significant other has been provided the following suicide prevention education, prior to the and/or following the discharge of the patient.  The suicide prevention education provided includes the following:  Suicide risk factors  Suicide prevention and interventions  National Suicide Hotline telephone number  [facility] assessment telephone number  [facility] Emergency Assistance 911  [facility] and/or Residential Mobile Crisis Unit telephone number  Request made of family/significant other to:  Remove weapons (e.g., guns, rifles, knives), all items previously/currently identified as safety concern.    Remove drugs/medications (over-the-counter, prescriptions, illicit drugs), all items previously/currently identified as a safety concern.  The family member/significant other verbalizes understanding of the suicide prevention education information provided.  The family member/significant other agrees to remove the items of safety concern listed above.  Margaret Marshall 07/19/2015, 4:06 PM

## 2015-07-19 NOTE — B[facility] Group Notes (Signed)
[facility] LCSW Aftercare Discharge Planning Group Note  07/19/2015 8:45 AM  Participation Quality: Alert, Appropriate and Oriented  Mood/Affect: Flat  Depression Rating: 2-3  Anxiety Rating: 2-3  Thoughts of Suicide: Pt denies SI/HI  Will you contract for safety? Yes  Current AVH: Pt denies  Plan for Discharge/Comments: Pt attended discharge planning group and actively participated in group. CSW discussed suicide prevention education with the group and encouraged them to discuss discharge planning and any relevant barriers. Pt reports that she is "fine" this morning and is agreeable to referral for outpatient resources.  Transportation Means: Pt reports access to transportation  Supports: No supports mentioned at this time  Chad Cordial, LCSWA 07/19/2015 9:47 AM

## 2015-07-19 NOTE — [facility] Group Notes (Signed)
[facility] LCSW Group Therapy  07/19/2015 1:15pm  Type of Therapy:  Group Therapy vercoming Obstacles  Participation Level:  Minimal  Participation Quality:  Reserved  Affect:  Flat; Withdrawn  Cognitive:  Appropriate and Oriented  Insight:  Developing/Improving   Engagement in Therapy:  Minimal  Modes of Intervention:  Discussion, Exploration, Problem-solving and Support  Description of Group:   In this group patients will be encouraged to explore what they see as obstacles to their own wellness and recovery. They will be guided to discuss their thoughts, feelings, and behaviors related to these obstacles. The group will process together ways to cope with barriers, with attention given to specific choices patients can make. Each patient will be challenged to identify changes they are motivated to make in order to overcome their obstacles. This group will be process-oriented, with patients participating in exploration of their own experiences as well as giving and receiving support and challenge from other group members.  Summary of Patient Progress: Pt participated minimally in group, but did identify excessive worry about the future as an obstacle that he is facing.   Therapeutic Modalities:   Cognitive Behavioral Therapy Solution Focused Therapy Motivational Interviewing Relapse Prevention Therapy   Chad Cordial, LCSWA 07/19/2015 2:35 PM

## 2015-07-19 NOTE — Progress Notes (Signed)
Adult Psychoeducational Group Note  Date:  07/19/2015 Time:  10:45 PM  Group Topic/Focus:  Wrap-Up Group:   The focus of this group is to help patients review their daily goal of treatment and discuss progress on daily workbooks.  Participation Level:  Active  Participation Quality:  Appropriate  Affect:  Appropriate  Cognitive:  Alert  Insight: Appropriate  Engagement in Group:  Engaged  Modes of Intervention:  Discussion  Additional Comments:  Patient stated having a good day. Patient goal for today was to be happy and to keep a smile on her face.   Jermaine Tholl L Sagal Gayton 07/19/2015, 10:45 PM

## 2015-07-19 NOTE — Plan of Care (Signed)
Problem: Consults Goal: Anxiety Disorder Patient Education See Patient Education Module for eduction specifics.  Outcome: Progressing Nurse discussed anxiety/coping skills with patient.        

## 2015-07-20 DIAGNOSIS — F332 Major depressive disorder, recurrent severe without psychotic features: Principal | ICD-10-CM

## 2015-07-20 LAB — HEMOGLOBIN A1C
Hgb A1c MFr Bld: 5.4 % (ref 4.8–5.6)
MEAN PLASMA GLUCOSE: 108 mg/dL

## 2015-07-20 LAB — PROLACTIN: PROLACTIN: 81.5 ng/mL — AB (ref 4.8–23.3)

## 2015-07-20 NOTE — Progress Notes (Signed)
At 1720 pt c/o feeling dizzy. Writer assessed pt v/s. Vital signs stable 124/55, 97, 98.7 100%. Pt verbalized that she may be dehydrated. Writer explained to pt that her v/s are stable. Writer offered pt fluids. Writer reported symptoms to Erskine Squibb, RN., pt nurse to f/u with pt and to reassess.

## 2015-07-20 NOTE — Progress Notes (Signed)
Pt attended spiritual care group on grief and loss facilitated by chaplain Berthel Bagnall   Group opened with brief discussion and psycho-social ed around grief and loss in relationships and in relation to self - identifying life patterns, circumstances, changes that cause losses. Established group norm of speaking from own life experience. Group goal of establishing open and affirming space for members to share loss and experience with grief, normalize grief experience and provide psycho social education and grief support.     

## 2015-07-20 NOTE — Progress Notes (Addendum)
[facility] MD Progress Note  07/20/2015 3:26 PM Margaret Marshall  MRN:  [id number] Subjective:  Reports improvement /feeling better. Does not endorse medication side effects at this time. Objective : I have discussed case with treatment team ahd have met with patient. 20 year old - identifies self as trans gender female to female- requests to be called Margaret Marshall . Reports feeling better at this time , feeling less depressed, and presents with full range of affect. Currently  focusing on being discharged soon. Tolerating Wellbutrin XL trial well- denies side effects. We reviewed side effect profile to include risk of seizures - patient has no history of seizure disorder and denies any history of eating disorder. Visible on unit, going to groups, interacting appropriate with other patients . Labs TSH elevated but FT4 within normal limits . Prolactin elevated ( on Risperidone) . HgbA1C 5.4, Lipid panel unremarkable .  Principal Problem: MDD (major depressive disorder), recurrent severe, without psychosis ([place]) Diagnosis:   Patient Active Problem List   Diagnosis Date Noted  . Gender dysphoria [F64.9] 07/19/2015  . MDD (major depressive disorder), recurrent severe, without psychosis ([place]) [F33.2] 07/18/2015   Total Time spent with patient: 20 minutes   Past Medical History:  Past Medical History  Diagnosis Date  . Mood disorder ([place])   . Anxiety   . Depression    History reviewed. No pertinent past surgical history. Family History:  Family History  Problem Relation Age of Onset  . Bipolar disorder Father     Social History:  History  Alcohol Use No    Comment: Rare     History  Drug Use No    Social History   Social History  . Marital Status: Single    Spouse Name: N/A  . Number of Children: N/A  . Years of Education: N/A   Social History Main Topics  . Smoking status: Never Smoker   . Smokeless tobacco: Never Used  . Alcohol Use: No     Comment: Rare  . Drug Use: No  . Sexual  Activity: Not Currently    Birth Control/ Protection: None   Other Topics Concern  . None   Social History Narrative   Additional Social History:    Pain Medications: pt denies abuse Prescriptions: pt denies abuse Over the Counter: pt denies abuse History of alcohol / drug use?: No history of alcohol / drug abuse Longest period of sobriety (when/how long): n/a  Sleep: improved   Appetite:  Good  Current Medications: Current Facility-Administered Medications  Medication Dose Route Frequency Provider Last Rate Last Dose  . acetaminophen (TYLENOL) tablet 650 mg  650 mg Oral Q6H PRN Benjamine Mola, FNP      . alum & mag hydroxide-simeth (MAALOX/MYLANTA) 200-200-20 MG/5ML suspension 30 mL  30 mL Oral Q4H PRN Benjamine Mola, FNP      . buPROPion (WELLBUTRIN XL) 24 hr tablet 150 mg  150 mg Oral Daily Ursula Alert, MD   150 mg at 07/20/15 0801  . clonazePAM (KLONOPIN) tablet 0.5 mg  0.5 mg Oral BID PRN Ursula Alert, MD      . hydrOXYzine (ATARAX/VISTARIL) tablet 25 mg  25 mg Oral Q6H PRN Benjamine Mola, FNP   25 mg at 07/20/15 1310  . magnesium hydroxide (MILK OF MAGNESIA) suspension 30 mL  30 mL Oral Daily PRN Benjamine Mola, FNP      . risperiDONE (RISPERDAL) tablet 1 mg  1 mg Oral QHS Benjamine Mola, FNP   1  mg at 07/19/15 2111  . traZODone (DESYREL) tablet 50 mg  50 mg Oral PRN Oneta Rack, NP        Lab Results:  Results for orders placed or performed during the hospital encounter of 07/18/15 (from the past 48 hour(s))  TSH     Status: Abnormal   Collection Time: 07/19/15  6:40 AM  Result Value Ref Range   TSH 10.554 (H) 0.350 - 4.500 uIU/mL    Comment: Performed at [facility]  T4, free     Status: None   Collection Time: 07/19/15  6:40 AM  Result Value Ref Range   Free T4 1.05 0.61 - 1.12 ng/dL    Comment: Performed at [facility]  Hemoglobin A1c     Status: None   Collection Time: 07/19/15  6:40 AM  Result Value Ref Range   Hgb A1c MFr  Bld 5.4 4.8 - 5.6 %    Comment: (NOTE)         Pre-diabetes: 5.7 - 6.4         Diabetes: >6.4         Glycemic control for adults with diabetes: <7.0    Mean Plasma Glucose 108 mg/dL    Comment: (NOTE) Performed At: [facility] [address] [place], [place] [id number] Mila Homer MD [id number] Performed at [facility]   Prolactin     Status: Abnormal   Collection Time: 07/19/15  6:40 AM  Result Value Ref Range   Prolactin 81.5 (H) 4.8 - 23.3 ng/mL    Comment: (NOTE) Performed At: [facility] [address] [place], [place] [id number] Mila Homer MD [id number] Performed at [facility]   Lipid panel     Status: Abnormal   Collection Time: 07/19/15  6:40 AM  Result Value Ref Range   Cholesterol 183 0 - 200 mg/dL   Triglycerides 203 <557 mg/dL   HDL 51 >33 mg/dL   Total CHOL/HDL Ratio 3.6 RATIO   VLDL 29 0 - 40 mg/dL   LDL Cholesterol 780 (H) 0 - 99 mg/dL    Comment:        Total Cholesterol/HDL:CHD Risk Coronary Heart Disease Risk Table                     Men   Women  1/2 Average Risk   3.4   3.3  Average Risk       5.0   4.4  2 X Average Risk   9.6   7.1  3 X Average Risk  23.4   11.0        Use the calculated Patient Ratio above and the CHD Risk Table to determine the patient's CHD Risk.        ATP III CLASSIFICATION (LDL):  <100     mg/dL   Optimal  108-106  mg/dL   Near or Above                    Optimal  130-159  mg/dL   Borderline  539-908  mg/dL   High  >520     mg/dL   Very High Performed at [facility]     Physical Findings: AIMS: Facial and Oral Movements Muscles of Facial Expression: None, normal Lips and Perioral Area: None, normal Jaw: None, normal Tongue: None, normal,Extremity Movements Upper (arms, wrists, hands, fingers): None, normal Lower (legs, knees, ankles, toes): None, normal, Trunk  Movements Neck, shoulders, hips: None, normal, Overall  Severity Severity of abnormal movements (highest score from questions above): None, normal Incapacitation due to abnormal movements: None, normal Patient's awareness of abnormal movements (rate only patient's report): No Awareness, Dental Status Current problems with teeth and/or dentures?: No Does patient usually wear dentures?: No  CIWA:  CIWA-Ar Total: 1 COWS:  COWS Total Score: 2  Musculoskeletal: Strength & Muscle Tone: within normal limits Gait & Station: normal Patient leans: N/A  Psychiatric Specialty Exam: ROS denies headache, denies chest pain or SOB.   Blood pressure 109/61, pulse 110, temperature 98.3 F (36.8 C), temperature source Oral, resp. rate 20, height 5' 6.5" (1.689 m), weight 123 lb (55.792 kg), last menstrual period 07/04/2015, SpO2 100 %.Body mass index is 19.56 kg/(m^2).  General Appearance: Well Groomed  Engineer, water::  Good  Speech:  Normal Rate  Volume:  Normal  Mood:  reports feeling better and currently does not endorse severe depression- states " I am better now"  Affect:  Appropriate and smiles at times appropriately  Thought Process:  Linear  Orientation:  Full (Time, Place, and Person)  Thought Content:  denies hallucinations, no delusions   Suicidal Thoughts:  No- currently denies any suicidal or self injurious ideations   Homicidal Thoughts:  No  Memory:  recent and remote grossly intact   Judgement:  Improved   Insight:   Improving   Psychomotor Activity:  Normal  Concentration:  Good  Recall:  Good  Fund of Knowledge:Good  Language: Good  Akathisia:  No  Handed:  Right  AIMS (if indicated):     Assets:  Communication Skills Desire for Improvement Physical Health  ADL's:  Intact  Cognition: WNL  Sleep:  Number of Hours: 6.5  Assessment - at this time patient is improving compared to admission- patient reports good tolerance and response to Wellbutrin XL. Has been facing recent stressors related to decision of proceeding with gender   transition and not having financial means to do this at present, which patient states is what led to tension with mother. Patient reports now feels more optimistic, hopeful, and less concerned about these issues. States " I know it is going to take time, I just need to be patient".  As noted, currently describes improved mood, no SI. Treatment Plan Summary: Daily contact with patient to assess and evaluate symptoms and progress in treatment, Medication management, Plan inpatient treatment  and medications as below  Continue to encourage milieu , group participation to work on coping skills and symptom reduction Continue Wellbutrin XL 150 mgrs QDAY for depression  Continue Risperidone 1 mgrs QHS for mood disorder  Continue Vistaril 25 mgrs Q 6 hours PRN for anxiety as needed  Continue Trazodone 50 mgrs QHS PRN for insomnia as needed  Consider discharge soon as patient continues to improve, stabilize . Patient aware of elevated TSH- encouraged to follow up with PCP for ongoing monitoring and treatment if indicated .  Janard Culp 07/20/2015, 3:26 PM

## 2015-07-20 NOTE — [facility] Group Notes (Signed)
[facility] Group Notes:  (Nursing/MHT/Case Management/Adjunct)  Date:  07/20/2015  Time:  10:41 AM   Type of Therapy:  Psychoeducational Skills  Participation Level:  Active  Participation Quality:  Appropriate  Affect:  Appropriate  Cognitive:  Appropriate  Insight:  Appropriate  Engagement in Group:  Engaged  Modes of Intervention:  Problem-solving  Summary of Progress/Problems: Summary of Progress/Problems: Topic was on was on recovery. Pt stated recovery to her is being "happy.'    Bethann Punches 07/20/2015, 10:41 AM

## 2015-07-20 NOTE — Progress Notes (Signed)
Adult Psychoeducational Group Note  Date:  07/20/2015 Time:  9:12 PM  Group Topic/Focus:  Wrap-Up Group:   The focus of this group is to help patients review their daily goal of treatment and discuss progress on daily workbooks.  Participation Level:  Active  Participation Quality:  Appropriate  Affect:  Appropriate  Cognitive:  Alert  Insight: Appropriate  Engagement in Group:  Engaged  Modes of Intervention:  Discussion  Additional Comments:  Patient stated having a good day. Something positive that happened today was "I got to talk with the doctor about my discharge plan. I will be discharging tomorrow".  [name] L [name] 07/20/2015, 9:12 PM

## 2015-07-20 NOTE — Progress Notes (Signed)
DAR NOTE: Pt present with depression mood and flat affect today. Pt has been present in the milieu watching TV with peers. Pt attended and participated in the group. As per selft inventory, pt had a good night sleep, good appetite, normal energy and good concentration. Pt states her goal for today is to " get recommendations on therapists/psychiatrists to see." Pt's safety ensured with 15 minute and environmental checks. Pt currently denies SI/HI and A/V hallucinations. Pt verbally agrees to seek staff if SI/HI or A/VH occurs and to consult with staff before acting on these thoughts. Will continue POC.

## 2015-07-20 NOTE — Progress Notes (Signed)
D: patient alert and oriented x 4. Patient denies pain/SI/HI/AVH. Patient questioning when she can be discharged. This Clinical research associate advised patient to talk with her doctor about possible discharge.  A: Staff to monitor Q 15 mins for safety. Encouragement and support offered. Scheduled medications administered per orders. R: Patient remains safe on the unit. Patient attended group tonight. Patient visible on hte unit and interacting with peers. Patient taking administered medications.

## 2015-07-20 NOTE — [facility] Group Notes (Signed)
[facility] LCSW Group Therapy 07/20/2015 1:15 PM  Type of Therapy: Group Therapy- Feelings about Diagnosis  Participation Level: Reserved  Participation Quality:  Appropriate  Affect:  Appropriate but withdrawn  Cognitive: Alert and Oriented   Insight:  Developing   Engagement in Therapy: Developing/Improving and Engaged   Modes of Intervention: Clarification, Confrontation, Discussion, Education, Exploration, Limit-setting, Orientation, Problem-solving, Rapport Building, Dance movement psychotherapist, Socialization and Support  Description of Group:   This group will allow patients to explore their thoughts and feelings about diagnoses they have received. Patients will be guided to explore their level of understanding and acceptance of these diagnoses. Facilitator will encourage patients to process their thoughts and feelings about the reactions of others to their diagnosis, and will guide patients in identifying ways to discuss their diagnosis with significant others in their lives. This group will be process-oriented, with patients participating in exploration of their own experiences as well as giving and receiving support and challenge from other group members.  Summary of Progress/Problems:  Pt identified having a history of anxiety and depression but reports that his was the first time she has experienced symptoms that lead to hospitalization. Pt described the difficult nature of depression as it takes away motivation to be treatment compliant. Pt then describes how she "stuffs it away" and does not deal with the symptoms effectively.  Therapeutic Modalities:   Cognitive Behavioral Therapy Solution Focused Therapy Motivational Interviewing Relapse Prevention Therapy  Chad Cordial, LCSWA 07/20/2015 5:01 PM

## 2015-07-21 MED ORDER — BUPROPION HCL ER (XL) 150 MG PO TB24: 150.0000 mg | ORAL_TABLET | Freq: Every day | ORAL | Status: DC

## 2015-07-21 MED ORDER — RISPERIDONE 1 MG PO TABS: 1.0000 mg | ORAL_TABLET | Freq: Every day | ORAL | Status: DC

## 2015-07-21 MED ORDER — HYDROXYZINE HCL 25 MG PO TABS: 25.0000 mg | ORAL_TABLET | Freq: Four times a day (QID) | ORAL | Status: DC | PRN

## 2015-07-21 MED ORDER — TRAZODONE HCL 50 MG PO TABS: 50.0000 mg | ORAL_TABLET | ORAL | Status: DC | PRN

## 2015-07-21 NOTE — Tx Team (Signed)
Interdisciplinary Treatment Plan Update (Adult) Date: 07/21/2015   Date: 07/21/2015 9:43 AM  Progress in Treatment:  Attending groups: Yes  Participating in groups: Yes  Taking medication as prescribed: Yes  Tolerating medication: Yes  Family/Significant othe contact made: Yes with mother Patient understands diagnosis: Yes AEB seeking help with depression  Discussing patient identified problems/goals with staff: Yes  Medical problems stabilized or resolved: Yes  Denies suicidal/homicidal ideation: Yes Patient has not harmed self or Others: Yes   New problem(s) identified: None identified at this time.   Discharge Plan or Barriers: Pt will discharge home and follow-up with outpatient resources.  Additional comments:  Patient and CSW reviewed pt's identified goals and treatment plan. Patient verbalized understanding and agreed to treatment plan. CSW reviewed [facility] "Discharge Process and Patient Involvement" Form. Pt verbalized understanding of information provided and signed form.   Reason for Continuation of Hospitalization:  Anxiety Depression Medication stabilization Suicidal ideation  Estimated length of stay: 0 days; Pt stable for DC  Review of initial/current patient goals per problem list:   1.  Goal(s): Patient will participate in aftercare plan  Met:  Yes  Target date: 3-5 days from date of admission   As evidenced by: Patient will participate within aftercare plan AEB aftercare provider and housing plan at discharge being identified.   07/19/15: Pt will return home and follow-up with outpatient services  2.  Goal (s): Patient will exhibit decreased depressive symptoms and suicidal ideations.  Met:  Yes  Target date: 3-5 days from date of admission   As evidenced by: Patient will utilize self rating of depression at 3 or below and demonstrate decreased signs of depression or be deemed stable for discharge by MD.  07/19/15: Pt rates depression at 2/10; denies  SI  3.  Goal(s): Patient will demonstrate decreased signs and symptoms of anxiety.  Met:  Yes  Target date: 3-5 days from date of admission   As evidenced by: Patient will utilize self rating of anxiety at 3 or below and demonstrated decreased signs of anxiety, or be deemed stable for discharge by MD  07/19/15: Pt rates anxiety at 2/10  Attendees:  Patient:    Family:    Physician: Dr. Parke Poisson, MD  07/21/2015 9:43 AM  Nursing: Lars Pinks, RN Case manager  07/21/2015 9:43 AM  Clinical Social Worker Peri Maris, [place] 07/21/2015 9:43 AM  Other: Tilden Fossa, [place] 07/21/2015 9:43 AM  Clinical: Perry Mount, RN  07/21/2015 9:43 AM  Other: , RN Charge Nurse 07/21/2015 9:43 AM  Other:     Peri Maris, [place] Work [phone]

## 2015-07-21 NOTE — [facility] Suicide Risk Assessment (Signed)
[facility]   Principal Problem: MDD (major depressive disorder), recurrent severe, without psychosis ([facility]) Discharge Diagnoses:  Patient Active Problem List   Diagnosis Date Noted  . Gender dysphoria [F64.9] 07/19/2015  . MDD (major depressive disorder), recurrent severe, without psychosis ([facility]) [F33.2] 07/18/2015    Total Time spent with patient: 30 minutes  Musculoskeletal: Strength & Muscle Tone: within normal limits Gait & Station: normal Patient leans: N/A  Psychiatric Specialty Exam: ROS  Blood pressure 109/65, pulse 110, temperature 98.1 F (36.7 C), temperature source Oral, resp. rate 20, height 5' 6.5" (1.689 m), weight 123 lb (55.792 kg), last menstrual period 07/04/2015, SpO2 100 %.Body mass index is 19.56 kg/(m^2).  General Appearance: Well Groomed  Eye Contact::  Good  Speech:  Normal Rate409  Volume:  Normal  Mood:  improved and minimizes depression at this time  Affect:  Appropriate and Full Range  Thought Process:  Linear  Orientation:  Full (Time, Place, and Person)  Thought Content:  denies hallucinations, no delusions   Suicidal Thoughts:  No denies any suicidal or self injurious ideations  Homicidal Thoughts:  No  Memory:  recent and remote grossly intact   Judgement:  Other:  improved  Insight:  improved   Psychomotor Activity:  Normal  Concentration:  Good  Recall:  Good  Fund of Knowledge:Good  Language: Good  Akathisia:  Negative  Handed:  Right  AIMS (if indicated):     Assets:  Communication Skills Desire for Improvement Resilience  Sleep:  Number of Hours: 6.25  Cognition: WNL  ADL's:  Intact   Mental Status Per Nursing Assessment::   On Admission:  NA  Demographic Factors:  20 year old , single, lives with mother, identifies self as transgender- female to female   Loss Factors: Argument with mother, concerns about financial costs related to transitioning   Historical Factors: History of self cutting,  history of depression  Risk Reduction Factors:   Sense of responsibility to family, Living with another person, especially a relative and Positive social support  Continued Clinical Symptoms:  At this time patient improved, with improved mood, denies depression at present, affect appropriate and reactive, no thought disorder, no SI or HI, no psychotic symptoms, future oriented - tolerating medications well .  Cognitive Features That Contribute To Risk:  No gross cognitive deficits noted upon discharge. Is alert , attentive, and oriented x 3     Suicide Risk:  Mild:  Suicidal ideation of limited frequency, intensity, duration, and specificity.  There are no identifiable plans, no associated intent, mild dysphoria and related symptoms, good self-control (both objective and subjective assessment), few other risk factors, and identifiable protective factors, including available and accessible social support.  Follow-up Information    Follow up with [facility].   Why:  Please complete the intake form sent to your email [email or web address] to have your first therapy appointment scheduled.   Contact information:   [address] [place], [place] [address]  ph: [phone]  f: [phone]       Follow up with [facility].   Why:  Please call this office at time of discharge to provide demographic information and have your first appointment scheduled.   Contact information:   [address] [place],  [place] [address]  Phone: [phone]  Fax: [phone]      Plan Of Care/Follow-up recommendations:  Activity:  as tolerated  Diet:  Regular Tests:  NA Other:  See below  Patient  leaving unit in good spirits  Plans to return home- lives with  Mother Follow up as above  Nehemiah Massed, MD 07/21/2015, 12:55 PM

## 2015-07-21 NOTE — [facility] Group Notes (Signed)
[facility] LCSW Aftercare Discharge Planning Group Note  07/21/2015 8:45 AM  Participation Quality: Alert, Appropriate and Oriented  Mood/Affect: Appropriate  Depression Rating: 0  Anxiety Rating: 0  Thoughts of Suicide: Pt denies SI/HI  Will you contract for safety? Yes  Current AVH: Pt denies  Plan for Discharge/Comments: Pt attended discharge planning group and actively participated in group. CSW discussed suicide prevention education with the group and encouraged them to discuss discharge planning and any relevant barriers. Pt expresses that he is ready for DC today and reports that his mother will pick him up.  Transportation Means: Pt reports access to transportation  Supports: No supports mentioned at this time  Chad Cordial, LCSWA 07/21/2015 9:42 AM

## 2015-07-21 NOTE — Progress Notes (Signed)
Discharge note:  Patient received all personal belongings.  Reviewed AVS/Discharge instructions with her and she indicated understanding.  Patient will follow up with [facility] and [facility].  Patient denies SI/HI/AVH.  Patient left ambulatory with her mother.

## 2015-07-21 NOTE — Discharge Summary (Signed)
Physician Discharge Summary Note  Patient:  Margaret Marshall is an 20 y.o., female MRN:  [id number] DOB:  1996-01-27 Patient phone:  [phone] (home)  Patient address:   [address] [place] [address],  Total Time spent with patient: 30 minutes  Date of Admission:  07/18/2015 Date of Discharge: 07/21/2015  Reason for Admission:  Depression  Principal Problem: MDD (major depressive disorder), recurrent severe, without psychosis [name]) Discharge Diagnoses: Patient Active Problem List   Diagnosis Date Noted  . Gender dysphoria [F64.9] 07/19/2015  . MDD (major depressive disorder), recurrent severe, without psychosis (HCC) [F33.2] 07/18/2015    Past Psychiatric History: see above noted  Past Medical History:  Past Medical History  Diagnosis Date  . Mood disorder (HCC)   . Anxiety   . Depression    History reviewed. No pertinent past surgical history. Family History:  Family History  Problem Relation Age of Onset  . Bipolar disorder Father    Family Psychiatric  History:  See above noted Social History:  History  Alcohol Use No    Comment: Rare     History  Drug Use No    Social History   Social History  . Marital Status: Single    Spouse Name: N/A  . Number of Children: N/A  . Years of Education: N/A   Social History Main Topics  . Smoking status: Never Smoker   . Smokeless tobacco: Never Used  . Alcohol Use: No     Comment: Rare  . Drug Use: No  . Sexual Activity: Not Currently    Birth Control/ Protection: None   Other Topics Concern  . None   Social History Narrative    Hospital Course:  Margaret Marshall was admitted for MDD (major depressive disorder), recurrent severe, without psychosis (HCC) and crisis management.  He was treated with the following medications as listed below.  Margaret Marshall was discharged with current medication and was instructed on how to take medications as prescribed; (details listed below under Medication List).  Medical problems  were identified and treated as needed.  Home medications were restarted as appropriate.  Improvement was monitored by observation and Margaret Marshall daily report of symptom reduction.  Emotional and mental status was monitored by daily self-inventory reports completed by Margaret Marshall and clinical staff.         Margaret Marshall was evaluated by the treatment team for stability and plans for continued recovery upon discharge.  Margaret Marshall motivation was an integral factor for scheduling further treatment.  Employment, transportation, bed availability, health status, family support, and any pending legal issues were also considered during his hospital stay.  He was offered further treatment options upon discharge including but not limited to Residential, Intensive Outpatient, and Outpatient treatment.  Margaret Marshall will follow up with the services as listed below under Follow Up Information.     Upon completion of this admission the Margaret Marshall was both mentally and medically stable for discharge denying suicidal/homicidal ideation, auditory/visual/tactile hallucinations, delusional thoughts and paranoia.     Physical Findings: AIMS: Facial and Oral Movements Muscles of Facial Expression: None, normal Lips and Perioral Area: None, normal Jaw: None, normal Tongue: None, normal,Extremity Movements Upper (arms, wrists, hands, fingers): None, normal Lower (legs, knees, ankles, toes): None, normal, Trunk Movements Neck, shoulders, hips: None, normal, Overall Severity Severity of abnormal movements (highest score from questions above): None, normal Incapacitation due to abnormal movements: None, normal Patient's awareness of abnormal movements (rate only patient's report): No Awareness, Dental  Status Current problems with teeth and/or dentures?: No Does patient usually wear dentures?: No  CIWA:  CIWA-Ar Total: 1 COWS:  COWS Total Score: 2  Musculoskeletal: Strength & Muscle Tone: within normal  limits Gait & Station: normal Patient leans: N/A  Psychiatric Specialty Exam:  See MD SRA Review of Systems  All other systems reviewed and are negative.   Blood pressure 109/65, pulse 110, temperature 98.1 F (36.7 C), temperature source Oral, resp. rate 20, height 5' 6.5" (1.689 m), weight 55.792 kg (123 lb), last menstrual period 07/04/2015, SpO2 100 %.Body mass index is 19.56 kg/(m^2).  Have you used any form of tobacco in the last 30 days? (Cigarettes, Smokeless Tobacco, Cigars, and/or Pipes): No  Has this patient used any form of tobacco in the last 30 days? (Cigarettes, Smokeless Tobacco, Cigars, and/or Pipes) Yes, offered  Metabolic Disorder Labs:  Lab Results  Component Value Date   HGBA1C 5.4 07/19/2015   MPG 108 07/19/2015   Lab Results  Component Value Date   PROLACTIN 81.5* 07/19/2015   Lab Results  Component Value Date   CHOL 183 07/19/2015   TRIG 143 07/19/2015   HDL 51 07/19/2015   CHOLHDL 3.6 07/19/2015   VLDL 29 07/19/2015   LDLCALC 103* 07/19/2015    See Psychiatric Specialty Exam and Suicide Risk Assessment completed by Attending Physician prior to discharge.  Discharge destination:  Home  Is patient on multiple antipsychotic therapies at discharge:  No   Has Patient had three or more failed trials of antipsychotic monotherapy by history:  No  Recommended Plan for Multiple Antipsychotic Therapies: NA     Medication List    STOP taking these medications        clonazePAM 0.5 MG tablet  Commonly known as:  KLONOPIN     lamoTRIgine 100 MG tablet  Commonly known as:  LAMICTAL      TAKE these medications      Indication   buPROPion 150 MG 24 hr tablet  Commonly known as:  WELLBUTRIN XL  Take 1 tablet (150 mg total) by mouth daily.   Indication:  Major Depressive Disorder     hydrOXYzine 25 MG tablet  Commonly known as:  ATARAX/VISTARIL  Take 1 tablet (25 mg total) by mouth every 6 (six) hours as needed for anxiety.   Indication:   Anxiety Neurosis     risperiDONE 1 MG tablet  Commonly known as:  RISPERDAL  Take 1 tablet (1 mg total) by mouth at bedtime.   Indication:  mood stabilization     traZODone 50 MG tablet  Commonly known as:  DESYREL  Take 1 tablet (50 mg total) by mouth as needed for sleep.   Indication:  Trouble Sleeping           Follow-up Information    Follow up with [facility].   Why:  Please complete the intake form sent to your email [email or web address] to have your first therapy appointment scheduled.   Contact information:   [address] [place], [place] [address]  ph: [phone]  f: [phone]       Follow up with [facility].   Why:  Please call this office at time of discharge to provide demographic information and have your first appointment scheduled.   Contact information:   [address] [place],  [place] [address]  Phone: [phone]  Fax: [phone]      Follow-up recommendations:  Activity:  as tol Diet:  as tol  Comments:  1.  Take all your medications as prescribed.              2.  Report any adverse side effects to outpatient provider.                       3.  Patient instructed to not use alcohol or illegal drugs while on prescription medicines.            4.  In the event of worsening symptoms, instructed patient to call 911, the crisis hotline or go to nearest emergency room for evaluation of symptoms.  Signed: Velna Hatchet May Agustin AGNP-BC 07/21/2015, 10:21 AM   Patient seen, Suicide Assessment Completed.  Disposition Plan Reviewed

## 2015-07-21 NOTE — Progress Notes (Signed)
D-  Patient has been in a pleasant mood.  Patient stated he had a bit of anxiety due to discharge but it was nothing that wasn't manageable.  Patient denies SI, HI, and AVH.  A- Assess patient for safety, help patient prepare for impending discharge, offer medications as prescribed.  R- Patient was able to contract for safety.

## 2015-07-21 NOTE — Progress Notes (Signed)
  [facility] Adult Case Management Discharge Plan :  Will you be returning to the same living situation after discharge:  Yes,  Pt returning home At discharge, do you have transportation home?: Yes,  mother to pick up Do you have the ability to pay for your medications: Yes,  Pt provided with 3-day supply and prescriptions  Release of information consent forms completed and in the chart;  Patient's signature needed at discharge.  Patient to Follow up at: Follow-up Information    Follow up with [facility].   Why:  Please complete the intake form sent to your email [email or web address] to have your first therapy appointment scheduled.   Contact information:   [address] [place], [place] [address]  ph: [phone]  f: [phone]       Follow up with [facility].   Why:  Please call this office at time of discharge to provide demographic information and have your first appointment scheduled.   Contact information:   [address] [place],  [place] [address]  Phone: [phone]  Fax: [phone]      Next level of care provider has access to [facility]:no  Safety Planning and Suicide Prevention discussed: Yes,  with mother; see SPE note for further details  Have you used any form of tobacco in the last 30 days? (Cigarettes, Smokeless Tobacco, Cigars, and/or Pipes): No  Has patient been referred to the Quitline?: N/A patient is not a smoker  Patient has been referred for addiction treatment: N/A  Elaina Hoops 07/21/2015, 9:59 AM

## 2015-07-21 NOTE — Progress Notes (Signed)
D. Pt had been up and visible in milieu this evening, did attend and participate in evening group activity. Pt did endorse anxiety this evening and spoke about how she was feeling anxious in part because she was getting discharged in the morning. Pt spoke about how she is feeing better since when she came in and spoke about how she feels ready for discharge. A. Support and encouragement provided. R. Safety maintained, will continue to monitor.

## 2015-07-29 ENCOUNTER — Ambulatory Visit (HOSPITAL_COMMUNITY): Payer: Self-pay | Admitting: Psychiatry

## 2015-08-03 ENCOUNTER — Encounter (HOSPITAL_COMMUNITY): Payer: Self-pay | Admitting: Psychiatry

## 2015-08-03 ENCOUNTER — Ambulatory Visit (INDEPENDENT_AMBULATORY_CARE_PROVIDER_SITE_OTHER): Payer: Managed Care, Other (non HMO) | Admitting: Psychiatry

## 2015-08-03 VITALS — BP 120/78 | HR 76 | Ht 66.25 in | Wt 124.0 lb

## 2015-08-03 DIAGNOSIS — F649 Gender identity disorder, unspecified: Secondary | ICD-10-CM

## 2015-08-03 DIAGNOSIS — F332 Major depressive disorder, recurrent severe without psychotic features: Secondary | ICD-10-CM

## 2015-08-03 DIAGNOSIS — F411 Generalized anxiety disorder: Secondary | ICD-10-CM | POA: Diagnosis not present

## 2015-08-03 MED ORDER — ARIPIPRAZOLE 5 MG PO TABS: 10.0000 mg | ORAL_TABLET | Freq: Every day | ORAL | Status: DC

## 2015-08-03 MED ORDER — BUPROPION HCL ER (XL) 300 MG PO TB24: 300.0000 mg | ORAL_TABLET | Freq: Every day | ORAL | Status: DC

## 2015-08-03 NOTE — Progress Notes (Signed)
Psychiatric Initial Adult Assessment   Patient Identification: Margaret Marshall MRN:  [id number] Date of Evaluation:  08/03/2015 Referral Source: [facility] H inpatient unit Chief Complaint:   depression and anxiety Visit Diagnosis:    ICD-9-CM ICD-10-CM   1. MDD (major depressive disorder), recurrent severe, without psychosis (HCC) 296.33 F33.2 CBC with Differential/Platelet     Comprehensive metabolic panel     Lipid panel     T4     TSH     Prolactin  2. GAD (generalized anxiety disorder) 300.02 F41.1 CBC with Differential/Platelet     Comprehensive metabolic panel     Lipid panel     T4     TSH     Prolactin  3. Gender dysphoria 302.6 F64.9 CBC with Differential/Platelet     Comprehensive metabolic panel     Lipid panel     T4     TSH     Prolactin   Diagnosis:   Patient Active Problem List   Diagnosis Date Noted  . GAD (generalized anxiety disorder) [F41.1] 08/03/2015    Priority: High  . Gender dysphoria [F64.9] 07/19/2015  . MDD (major depressive disorder), recurrent severe, without psychosis (HCC) [F33.2] 07/18/2015   History of Present Illness:  20 year old white female transferred from [facility] H inpatient adult unit(for outpatient care. Patient had been hospitalized on the [facility] H inpatient unit from 120-17 to 07/21/2015. Patient had become depressed and had suicidal ideation with a plan to overdose on her medications at that time which included Wellbutrin, Klonopin, Risperdal and Lamictal.  Patient stated she had multiple stressors which include gender dysphoria patient is a female that wants to change into a female and cannot afford the treatment. She is very stressed about this also has a history of cutting had been feeling hopeless and helpless prior to admission. In addition to that she works part-time at [employer] and lives with her mother and the parents are divorced and she used to be on her father's insurance and dad lost his job and so now she is on her [employer].  On the inpatient unit her Lamictal and Klonopin were discontinued. She was continued on Wellbutrin XL 150 mg daily, Risperdal is 1 mg daily at bedtime and was put on Vistaril 25 mg every 6 hours and trazodone 50 mg daily at bedtime.  Elevated labs at that time included high prolactin and a high TSH.  Since discharge from the hospital patient states that her sleep is good appetite tends to fluctuate she snacks a lot, mood is improving she still anxious and they're in the process of tapering the Klonopin and discontinuing it she is presently on Klonopin 0.25 mg for another week and then will discontinue it. She is also being tapered off the Risperdal she is currently on Risperdal 0.5 mg daily at bedtime for 1 week and then this will decrease to 0.25 mg for another week and discontinued. Patient is experienced no difficulties with the taper.  States that she is in a relationship, has never been sexually active and her last menstrual period was several months ago.    Associated Signs/Symptoms: Depression Symptoms:  depressed mood, psychomotor retardation, fatigue, feelings of worthlessness/guilt, anxiety, disturbed sleep, decreased appetite, (Hypo) Manic Symptoms:  Irritable Mood, Anxiety Symptoms:  Excessive Worry, Psychotic Symptoms:  None PTSD Symptoms: NA Previous Psychotropic Medications: Yes   Substance Abuse History in the last 12 months:  No.  Consequences of Substance Abuse: NA  Past psychiatric history-patient was hospitalized on  [facility] H inpatient unit from 07/18/2015 through 07/21/2015.                                           She has seen Dr. Toni Arthurs on an outpatient basis and has also seen a therapist keep Margaret Marshall.   Past Medical History: As listed Past Medical History  Diagnosis Date  . Mood disorder (HCC)   . Anxiety   . Depression    No past surgical history on file.    Family History: Dad has ADHD and bipolar disorder., 3 siblings have depression  and anxiety, maternal grandfather has alcohol problems, maternal uncle has drug problems. Family History  Problem Relation Age of Onset  . Bipolar disorder Father    Social History:  Patient currently lives with her mother Social History   Social History  . Marital Status: Single    Spouse Name: N/A  . Number of Children: N/A  . Years of Education: N/A   Social History Main Topics  . Smoking status: Never Smoker   . Smokeless tobacco: Never Used  . Alcohol Use: No     Comment: Rare  . Drug Use: No  . Sexual Activity: Not Currently    Birth Control/ Protection: None   Other Topics Concern  . None   Social History Narrative   Additional Social History: She was born and raised in [place] states her schooling was good middle school was good but high school was difficult and then after high school she came out and decided to change her 6. Worries about her inability to pay for the treatment.  Musculoskeletal: Strength & Muscle Tone: within normal limits Gait & Station: normal Patient leans: stands straight  Psychiatric Specialty Exam: HPI  Review of Systems  Psychiatric/Behavioral: Positive for depression. The patient is nervous/anxious.   All other systems reviewed and are negative.   Blood pressure 120/78, pulse 76, height 5' 6.25" (1.683 m), weight 124 lb (56.246 kg), last menstrual period 07/04/2015.Body mass index is 19.86 kg/(m^2).  General Appearance: Casual  Eye Contact:  Good  Speech:  Clear and Coherent and Normal Rate  Volume:  Normal  Mood:  Anxious, Depressed and Dysphoric  Affect:  Constricted and Depressed  Thought Process:  Goal Directed, Linear and Logical  Orientation:  Full (Time, Place, and Person)  Thought Content:  Rumination  Suicidal Thoughts:  No  Homicidal Thoughts:  No  Memory:  Immediate;   Good Recent;   Good Remote;   Good  Judgement:  Intact  Insight:  Present  Psychomotor Activity:  Normal  Concentration:  Fair  Recall:  Good   Fund of Knowledge:Good  Language: Good  Akathisia:  No  Handed:  Right  AIMS (if indicated):  0  Assets:  Communication Skills Desire for Improvement Financial Resources/Insurance Housing Physical Health Resilience Social Support  ADL's:  Intact  Cognition: WNL  Sleep:  Fair    Is the patient at risk to self?  No. Has the patient been a risk to self in the past 6 months?  Yes.   Has the patient been a risk to self within the distant past?  No. Is the patient a risk to others?  No. Has the patient been a risk to others in the past 6 months?  No. Has the patient been a risk to others within the distant past?  No.  Allergies:  No Known Allergies Current Medications: Current Outpatient Prescriptions  Medication Sig Dispense Refill  . ARIPiprazole (ABILIFY) 5 MG tablet Take 2 tablets (10 mg total) by mouth daily. 30 tablet 2  . buPROPion (WELLBUTRIN XL) 300 MG 24 hr tablet Take 1 tablet (300 mg total) by mouth daily. 30 tablet 2   No current facility-administered medications for this visit.      Medical Decision Making:  Review of Psycho-Social Stressors (1), Review or order clinical lab tests (1), Review and summation of old records (2), New Problem, with no additional work-up planned (3), Review of Medication Regimen & Side Effects (2) and Review of New Medication or Change in Dosage (2)  Treatment Plan Summary: Medication management #1 Maj. depressive disorder severe without psychotic features. Increase Wellbutrin XL 300 mg by mouth every morning. Discussed rationale risks benefits options of Abilify with the patient and obtained informed consent she'll start Abilify 5 mg by mouth every at bedtime a coupon has been given. Decrease Risperdal 0.25 mg for one week then discontinue it. #2 generalized anxiety disorder Taper and DC Klonopin she is currently on 0.25 mg daily for 1 week and she'll decrease it to 0.125 for another week and DC. Relaxation techniques and deep  breathing was discussed in detail with the patient. DC Vistaril) as patient is noncompliant. DC trazodone as patient is noncompliant. #3 gender dysphoria Patient will continue with her therapist regarding this. #4 labs Will repeat CBC, CMP, TSH T4 prolactin level and a lipid panel. #5 therapy She'll continue with Celeste neck nettles at [facility]. #6 she'll return to see me in the clinic in 2 weeks. This was a 60 minute visit it was an initial assessment of high-intensity more than 50% of the time was spent in counseling and care coordination. Diagnoses and medications were discussed in detail. Cognitive behavior therapy and identification of triggers for her anxiety was discussed interpersonal and supportive therapy was provided.   Margit Banda 2/7/20172:06 PM

## 2015-08-09 ENCOUNTER — Telehealth (HOSPITAL_COMMUNITY): Payer: Self-pay

## 2015-08-09 DIAGNOSIS — F332 Major depressive disorder, recurrent severe without psychotic features: Secondary | ICD-10-CM

## 2015-08-09 MED ORDER — ARIPIPRAZOLE 10 MG PO TABS: 5.0000 mg | ORAL_TABLET | Freq: Every day | ORAL | Status: DC

## 2015-08-09 NOTE — Telephone Encounter (Signed)
Met with Dr. Salem Senate to discuss a prior authorization request we had received for patient's newly ordered Abilify 5 mg, 2 a day due to quantity limit exceeded.  Discussed with Dr. Salem Senate from review of note 08/03/15 was patient to be getting a total of 10 mg daily or 5 mg and agreed to call in a new order changed to Abilify 10 mg, 1/2 tablet a day as patient is returning on 08/17/15 and Dr. Salem Senate reported plan to go up to 10 mg daily at that time if needed.  Called patient to inform of change as patient has not taken any Abilify as of this date yet but is tapering off Risperdal.  Patient agreed to pick up new order this date and to take 1/2 a tablet of 10 mg Abilify until returns to see Dr. Salem Senate.  Called in order to [facility] in [place] with [place], pharmacist and they will contact us back if any problems filling new order as authorized Abilify 10 mg, 1/2 daily, #15 with no refills.  Patient to return on 08/17/15 and to call if any problems with change prior to that appointment.

## 2015-08-13 ENCOUNTER — Other Ambulatory Visit (HOSPITAL_COMMUNITY): Payer: Self-pay | Admitting: Psychiatry

## 2015-08-13 LAB — COMPREHENSIVE METABOLIC PANEL
ALBUMIN: 4.7 g/dL (ref 3.6–5.1)
ALT: 7 U/L (ref 5–32)
AST: 14 U/L (ref 12–32)
Alkaline Phosphatase: 72 U/L (ref 47–176)
BILIRUBIN TOTAL: 0.4 mg/dL (ref 0.2–1.1)
BUN: 10 mg/dL (ref 7–20)
CALCIUM: 9.6 mg/dL (ref 8.9–10.4)
CO2: 25 mmol/L (ref 20–31)
Chloride: 106 mmol/L (ref 98–110)
Creat: 0.86 mg/dL (ref 0.50–1.00)
GLUCOSE: 86 mg/dL (ref 65–99)
POTASSIUM: 4.3 mmol/L (ref 3.8–5.1)
Sodium: 139 mmol/L (ref 135–146)
Total Protein: 7.2 g/dL (ref 6.3–8.2)

## 2015-08-13 LAB — CBC WITH DIFFERENTIAL/PLATELET
Basophils Absolute: 0 10*3/uL (ref 0.0–0.1)
Basophils Relative: 1 % (ref 0–1)
EOS PCT: 4 % (ref 0–5)
Eosinophils Absolute: 0.2 10*3/uL (ref 0.0–0.7)
HEMATOCRIT: 39.5 % (ref 36.0–46.0)
HEMOGLOBIN: 12.8 g/dL (ref 12.0–15.0)
LYMPHS ABS: 2.1 10*3/uL (ref 0.7–4.0)
LYMPHS PCT: 45 % (ref 12–46)
MCH: 27.1 pg (ref 26.0–34.0)
MCHC: 32.4 g/dL (ref 30.0–36.0)
MCV: 83.5 fL (ref 78.0–100.0)
MONO ABS: 0.4 10*3/uL (ref 0.1–1.0)
MONOS PCT: 9 % (ref 3–12)
MPV: 10.1 fL (ref 8.6–12.4)
NEUTROS ABS: 1.9 10*3/uL (ref 1.7–7.7)
Neutrophils Relative %: 41 % — ABNORMAL LOW (ref 43–77)
Platelets: 281 10*3/uL (ref 150–400)
RBC: 4.73 MIL/uL (ref 3.87–5.11)
RDW: 14.9 % (ref 11.5–15.5)
WBC: 4.6 10*3/uL (ref 4.0–10.5)

## 2015-08-13 LAB — T4: T4 TOTAL: 7.5 ug/dL (ref 4.5–12.0)

## 2015-08-13 LAB — LIPID PANEL
CHOL/HDL RATIO: 3.3 ratio (ref <=5.0)
Cholesterol: 186 mg/dL — ABNORMAL HIGH (ref 125–170)
HDL: 56 mg/dL (ref 36–76)
LDL CALC: 115 mg/dL — AB (ref <110)
TRIGLYCERIDES: 74 mg/dL (ref 40–136)
VLDL: 15 mg/dL (ref <30)

## 2015-08-13 LAB — TSH: TSH: 2.42 mIU/L (ref 0.50–4.30)

## 2015-08-14 LAB — PROLACTIN: Prolactin: 15.6 ng/mL

## 2015-08-17 ENCOUNTER — Ambulatory Visit (INDEPENDENT_AMBULATORY_CARE_PROVIDER_SITE_OTHER): Payer: Managed Care, Other (non HMO) | Admitting: Psychiatry

## 2015-08-17 ENCOUNTER — Encounter (HOSPITAL_COMMUNITY): Payer: Self-pay | Admitting: Psychiatry

## 2015-08-17 VITALS — BP 115/73 | HR 80 | Ht 67.0 in | Wt 120.6 lb

## 2015-08-17 DIAGNOSIS — F332 Major depressive disorder, recurrent severe without psychotic features: Secondary | ICD-10-CM | POA: Diagnosis not present

## 2015-08-17 DIAGNOSIS — F649 Gender identity disorder, unspecified: Secondary | ICD-10-CM

## 2015-08-17 DIAGNOSIS — F411 Generalized anxiety disorder: Secondary | ICD-10-CM | POA: Diagnosis not present

## 2015-08-17 MED ORDER — ARIPIPRAZOLE 10 MG PO TABS: 5.0000 mg | ORAL_TABLET | Freq: Every day | ORAL | Status: DC

## 2015-08-17 NOTE — Progress Notes (Signed)
BH H progress note  Patient Identification: Margaret Marshall MRN:  [id number] Date of Evaluation:  08/17/2015  Subjective- I'm doing okay  Visit Diagnosis:    ICD-9-CM ICD-10-CM   1. GAD (generalized anxiety disorder) 300.02 F41.1   2. Gender dysphoria 302.6 F64.9   3. MDD (major depressive disorder), recurrent severe, without psychosis (HCC) 296.33 F33.2    Diagnosis:   Patient Active Problem List   Diagnosis Date Noted  . GAD (generalized anxiety disorder) [F41.1] 08/03/2015    Priority: High  . Gender dysphoria [F64.9] 07/19/2015  . MDD (major depressive disorder), recurrent severe, without psychosis (HCC) [F33.2] 07/18/2015   History of Present Illness: -- patient was seen today along with her mother initially with patient's permission later alone. Patient states she had no difficulty getting off of the Risperdal which they did at her last visit. Patient is started the Abilify about a week ago and is tolerating it well. Discussed with her that her prolactin has decreased to 15 and she is happy about that. Also discussed the high lipids and asked her to keep a healthy diet she stated understanding.    patient states that her sleep is good she is able to sleep through the night, feels rested Margaret Marshall appetite is good mood is improving and better some days are bad but they are situationally related and does not feel depressed as she did before. Denies feeling anhedonic her energy is good denies feeling hopeless or helpless. Denies suicidal or homicidal ideation no hallucinations or delusions.   she continues to see [facility] and likes it. Overall is tolerating her medications well and is coping well.                                                                     Notes from her initial visit with Dr. Rutherford Limerick  20 year old white female transferred from [facility] H inpatient adult unit(for outpatient care. Patient had been hospitalized on the [facility] H inpatient  unit from 120-17 to 07/21/2015. Patient had become depressed and had suicidal ideation with a plan to overdose on her medications at that time which included Wellbutrin, Klonopin, Risperdal and Lamictal. Patient stated she had multiple stressors which include gender dysphoria patient is a female that wants to change into a female and cannot afford the treatment. She is very stressed about this also has a history of cutting had been feeling hopeless and helpless prior to admission. In addition to that she works part-time at [employer] and lives with her mother and the parents are divorced and she used to be on her father's insurance and dad lost his job and so now she is on her [employer]. On the inpatient unit her Lamictal and Klonopin were discontinued. She was continued on Wellbutrin XL 150 mg daily, Risperdal is 1 mg daily at bedtime and was put on Vistaril 25 mg every 6 hours and trazodone 50 mg daily at bedtime.  Elevated labs at that time included high prolactin and a high TSH.  Since discharge from the hospital patient states that her sleep is good appetite tends to fluctuate she snacks a lot, mood is improving she still anxious and they're in the process of tapering the Klonopin  and discontinuing it she is presently on Klonopin 0.25 mg for another week and then will discontinue it. She is also being tapered off the Risperdal she is currently on Risperdal 0.5 mg daily at bedtime for 1 week and then this will decrease to 0.25 mg for another week and discontinued. Patient is experienced no difficulties with the taper.  States that she is in a relationship, has never been sexually active and her last menstrual period was several months ago.     Previous Psychotropic Medications: Yes   Substance Abuse History in the last 12 months:  No.  Consequences of Substance Abuse: NA  Past psychiatric history-  patient was hospitalized on [facility] H inpatient unit from 07/18/2015 through  07/21/2015.                                           She has seen Dr. Toni Arthurs on an outpatient basis and has also seen a therapist keep Margaret Marshall.   Past Medical History: As listed Past Medical History  Diagnosis Date  . Mood disorder (HCC)   . Anxiety   . Depression    No past surgical history on file.    Family History: Dad has ADHD and bipolar disorder., 3 siblings have depression and anxiety, maternal grandfather has alcohol problems, maternal uncle has drug problems. Family History  Problem Relation Age of Onset  . Bipolar disorder Father    Social History:  Patient currently lives with her mother Social History   Social History  . Marital Status: Single    Spouse Name: N/A  . Number of Children: N/A  . Years of Education: N/A   Social History Main Topics  . Smoking status: Never Smoker   . Smokeless tobacco: Never Used  . Alcohol Use: No     Comment: Rare  . Drug Use: No  . Sexual Activity: Not Currently    Birth Control/ Protection: None   Other Topics Concern  . Not on file   Social History Narrative   Additional Social History: She was born and raised in [place] states her schooling was good middle school was good but high school was difficult and then after high school she came out and decided to change her 6. Worries about her inability to pay for the treatment.  Musculoskeletal: Strength & Muscle Tone: within normal limits Gait & Station: normal Patient leans: stands straight  Psychiatric Specialty Exam: HPI  Review of Systems  Psychiatric/Behavioral: Positive for depression. The patient is nervous/anxious.   All other systems reviewed and are negative.   Blood pressure 115/73, pulse 80, height 5\' 7"  (1.702 m), weight 120 lb 9.6 oz (54.704 kg), last menstrual period 07/04/2015.Body mass index is 18.88 kg/(m^2).  General Appearance: Casual  Eye Contact:  Good  Speech:  Clear and Coherent and Normal Rate  Volume:  Normal  Mood:   Mildly anxious   Affect:   appropriate  Thought Process:  Goal Directed, Linear and Logical  Orientation:  Full (Time, Place, and Person)  Thought Content:   WDL  Suicidal Thoughts:  No  Homicidal Thoughts:  No  Memory:  Immediate;   Good Recent;   Good Remote;   Good  Judgement:  Intact  Insight:  Present  Psychomotor Activity:  Normal  Concentration:  Fair  Recall:  Good  Fund of Knowledge:Good  Language: Good  Akathisia:  No  Handed:  Right  AIMS (if indicated):  0  Assets:  Communication Skills Desire for Improvement Financial Resources/Insurance Housing Physical Health Resilience Social Support  ADL's:  Intact  Cognition: WNL  Sleep:  Fair    Is the patient at risk to self?  No. Has the patient been a risk to self in the past 6 months?  Yes.   Has the patient been a risk to self within the distant past?  No. Is the patient a risk to others?  No. Has the patient been a risk to others in the past 6 months?  No. Has the patient been a risk to others within the distant past?  No.  Allergies:  No Known Allergies Current Medications: Current Outpatient Prescriptions  Medication Sig Dispense Refill  . ARIPiprazole (ABILIFY) 10 MG tablet Take 0.5 tablets (5 mg total) by mouth daily. 15 tablet 0  . buPROPion (WELLBUTRIN XL) 300 MG 24 hr tablet Take 1 tablet (300 mg total) by mouth daily. 30 tablet 2   No current facility-administered medications for this visit.      Medical Decision Making:  Review of Psycho-Social Stressors (1), Review or order clinical lab tests (1), Review and summation of old records (2), New Problem, with no additional work-up planned (3), Review of Medication Regimen & Side Effects (2) and Review of New Medication or Change in Dosage (2)  Treatment Plan Summary: Medication management #1 Maj. depressive disorder severe without psychotic features.  continueWellbutrin XL 300 mg by mouth every morning.  continue Abilify 5 mg by mouth every at bedtime  . #2  generalized anxiety disorder. Relaxation techniques and deep breathing was discussed in detail with the patient.  #3 gender dysphoria Patient will continue with her therapist regarding this. #4 labs  CBC, CMP, TSH T4 and prolactin level was normal and  her lipid panel. Was elevated discussed a healthy diet patient stated understanding. #5 therapy She'll continue with Celeste neck nettles [facility].  #6 she'll return to see me in the clinic in 5 weeks.  This was a 30 minute visit  more than 50% of the time was spent in counseling and care coordination. Diagnoses and medications were discussed in detail. Cognitive behavior therapy and identification of triggers for her anxiety was discussed interpersonal and supportive therapy was provided.   Margit Banda 2/21/20172:28 PM

## 2015-09-21 ENCOUNTER — Ambulatory Visit (HOSPITAL_COMMUNITY): Payer: Self-pay | Admitting: Psychiatry

## 2015-09-24 ENCOUNTER — Encounter (HOSPITAL_COMMUNITY): Payer: Self-pay | Admitting: Licensed Clinical Social Worker

## 2015-09-24 ENCOUNTER — Ambulatory Visit (HOSPITAL_COMMUNITY): Payer: Managed Care, Other (non HMO) | Admitting: Licensed Clinical Social Worker

## 2015-09-24 DIAGNOSIS — F411 Generalized anxiety disorder: Secondary | ICD-10-CM

## 2015-09-24 DIAGNOSIS — F332 Major depressive disorder, recurrent severe without psychotic features: Secondary | ICD-10-CM

## 2015-09-24 NOTE — Psych (Signed)
Margaret Marshall is a 20 y.o. female patient. Met with pt who is presenting to enter the [facility] program for depression and anxiety. She called requesting to enter the program, having been at [facility] in January, see Dr. Darene Lamer at [facility] and Trevor Mace at [facility] [employer]. Pt is taking Abilify and Wellbutrin as prescribed by Dr. Darene Lamer. She has told her therapist at [place] that she is going to enter a program at [facility]. 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 She lives with her mother and has limited contact with her father who lives in [place]. Reviewed and updated the CCA with pt. Pt desires to be in [facility] for 2 weeks and has changed her hours at work to accomodate the hours of PHP. When pt was told of the # of pts in PHP pt declined. She decided she would rather go to [place]. Brought her mother into session to discuss the decision at pt's request. Pt has an appt with Dellia Nims on Monday 4/10 to begin IOP.         Sharmila Wrobleski S, Licensed Cli

## 2015-09-24 NOTE — Psych (Signed)
Comprehensive Clinical Assessment (CCA) Note Update Only 09/24/15  09/24/2015 Margaret Marshall [id number]  Visit Diagnosis:   GAD (General Anxiety Disorder), MDD (Major Depressive Disorder) without psychosis    CCA Part One  Part One has been completed on paper by the patient.  (See scanned document in Chart Review)  CCA Part Two A  Intake/Chief Complaint:  CCA Intake With Chief Complaint CCA Part Two Date: 09/24/15 CCA Part Two Time: 1111 Chief Complaint/Presenting Problem: depression and anxietiy Patients Currently Reported Symptoms/Problems: depression is 1 out of 0-10 scale. Anxiety is 3 out of 0-10 scale Collateral Involvement: Mother Individual'Marshall Strengths: supportive family, has 1 brother in town Individual'Marshall Preferences: Prefers outpatient program to help deal with her anxiety and depression Individual'Marshall Abilities: learn to deal with her anxiety and depression Type of Services Patient Feels Are Needed: PHP  Mental Health Symptoms Depression:  Depression: Change in energy/activity, Difficulty Concentrating, Irritability  Mania:     Anxiety:   Anxiety: Worrying, Tension, Restlessness  Psychosis:     Trauma:     Obsessions:  Obsessions: Cause anxiety, Recurrent & persistent thoughts/impulses/images  Compulsions:     Inattention:     Hyperactivity/Impulsivity:     Oppositional/Defiant Behaviors:     Borderline Personality:  Emotional Irregularity: Unstable self-image  Other Mood/Personality Symptoms:      Mental Status Exam Appearance and self-care  Stature:  Stature: Average  Weight:  Weight: Average weight  Clothing:  Clothing: Casual  Grooming:  Grooming: Normal  Cosmetic use:  Cosmetic Use: None  Posture/gait:  Posture/Gait: Normal  Motor activity:  Motor Activity: Not Remarkable  Sensorium  Attention:  Attention: Normal  Concentration:  Concentration: Normal  Orientation:  Orientation: X5  Recall/memory:  Recall/Memory: Normal  Affect and Mood  Affect:   Affect: Anxious  Mood:  Mood: Anxious  Relating  Eye contact:  Eye Contact: Avoided  Facial expression:  Facial Expression: Anxious  Attitude toward examiner:  Attitude Toward Examiner: Cooperative  Thought and Language  Speech flow: Speech Flow: Pressured  Thought content:  Thought Content: Appropriate to mood and circumstances  Preoccupation:     Hallucinations:     Organization:     Company secretary of Knowledge:  Fund of Knowledge: Average  Intelligence:  Intelligence: Average  Abstraction:  Abstraction: Normal  Judgement:  Judgement: Fair  Dance movement psychotherapist:  Reality Testing: Variable  Insight:  Insight: Good  Decision Making:  Decision Making:  (over think things)  Social Functioning  Social Maturity:  Social Maturity: Isolates  Social Judgement:  Social Judgement: Naive  Stress  Stressors:     Coping Ability:     Skill Deficits:     Supports:      Family and Psychosocial History: Family history Marital status: Single Are you sexually active?: No What is your sexual orientation?: dysphoric  Childhood History:     CCA Part Two B  Employment/Work Situation:    Education:    Religion:    Leisure/Recreation:    Exercise/Diet:    CCA Part Two C  Alcohol/Drug Use:                        CCA Part Three  ASAM'Marshall:  Six Dimensions of Multidimensional Assessment  Dimension 1:  Acute Intoxication and/or Withdrawal Potential:     Dimension 2:  Biomedical Conditions and Complications:     Dimension 3:  Emotional, Behavioral, or Cognitive Conditions and Complications:     Dimension 4:  Readiness  to Change:     Dimension 5:  Relapse, Continued use, or Continued Problem Potential:     Dimension 6:  Recovery/Living Environment:      Substance use Disorder (SUD)    Social Function:  Social Functioning Social Maturity: Isolates Social Judgement: Naive  Stress:     Risk Assessment- Self-Harm Potential:    Risk Assessment -Dangerous  to Others Potential:    DSM5 Diagnoses: Patient Active Problem List   Diagnosis Date Noted  . GAD (generalized anxiety disorder) 08/03/2015  . Gender dysphoria 07/19/2015  . MDD (major depressive disorder), recurrent severe, without psychosis (HCC) 07/18/2015    Patient Centered Plan: Patient is on the following Treatment Plan(Marshall):  Anxiety and DepressionSee  Recommendations for Services/Supports/Treatments: Pt is being referred to [facility]. Her appt is set for Monday 10/04/15.    Treatment Plan Summary:     Referrals to Alternative Service(Marshall): Referred to Alternative Service(Marshall):   Place:   Date:   Time:    Referred to Alternative Service(Marshall):   Place:   Date:   Time:    Referred to Alternative Service(Marshall):   Place:   Date:   Time:    Referred to Alternative Service(Marshall):   Place:   Date:   Time:     Margaret Marshall

## 2015-09-27 ENCOUNTER — Other Ambulatory Visit (HOSPITAL_COMMUNITY): Payer: Self-pay

## 2015-09-27 DIAGNOSIS — F411 Generalized anxiety disorder: Secondary | ICD-10-CM

## 2015-09-27 NOTE — Telephone Encounter (Signed)
Fax received from patients pharmacy for a 90 day refill on Wellbutrin. Patient was last seen on 2/21 and does not currently have a follow up appointment. Okay to refill? I can call patient to schedule her follow up. Please advise, thank you

## 2015-09-29 ENCOUNTER — Other Ambulatory Visit (HOSPITAL_COMMUNITY): Payer: Self-pay

## 2015-09-29 DIAGNOSIS — F411 Generalized anxiety disorder: Secondary | ICD-10-CM

## 2015-09-29 MED ORDER — BUPROPION HCL ER (XL) 300 MG PO TB24: 300.0000 mg | ORAL_TABLET | Freq: Every day | ORAL | Status: DC

## 2015-09-29 NOTE — Telephone Encounter (Signed)
Per Dr. Rutherford Limerickadepalli, a 30 day order was sent to the pharmacy, patient called and told he needs to make a follow up appointment

## 2015-10-04 ENCOUNTER — Encounter (HOSPITAL_COMMUNITY): Payer: Self-pay | Admitting: Psychiatry

## 2015-10-04 ENCOUNTER — Other Ambulatory Visit (HOSPITAL_COMMUNITY): Payer: Managed Care, Other (non HMO) | Attending: Psychiatry | Admitting: Psychiatry

## 2015-10-04 DIAGNOSIS — F411 Generalized anxiety disorder: Secondary | ICD-10-CM | POA: Diagnosis present

## 2015-10-04 DIAGNOSIS — F332 Major depressive disorder, recurrent severe without psychotic features: Secondary | ICD-10-CM

## 2015-10-04 DIAGNOSIS — F649 Gender identity disorder, unspecified: Secondary | ICD-10-CM
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
## 2015-10-04 NOTE — Progress Notes (Signed)
Margaret Norrisnna Gutt  [id number]   Daily Group Progress Note  Program: IOP  Group Time: 10 AM to 10:30 AM  Participation Level: Appropriate  Behavioral Response: Appropriate  Type of Therapy:  Psycho-education Group  Summary of Progress: Patient was welcomed by current group members and provided some orientation on program before break.    Group Time: 10:30 AM  to 12 PM  Participation Level:  Appropriate  Behavioral Response: Attentive  Type of Therapy: Process Group  Summary of Progress:  Group attention was focused on 7 min visual/musical meditation which was introduced as one of several methods of meditation. Patients processed their experience and looked at pros cons of including in their daily practice. Patient shared concerns that she "should be" in college and/or working.   Carney Bernatherine C Emeterio Balke, LCSW

## 2015-10-05 ENCOUNTER — Encounter (HOSPITAL_COMMUNITY): Payer: Self-pay | Admitting: Psychiatry

## 2015-10-05 ENCOUNTER — Other Ambulatory Visit (HOSPITAL_COMMUNITY): Payer: Managed Care, Other (non HMO) | Admitting: Psychiatry

## 2015-10-05 DIAGNOSIS — F411 Generalized anxiety disorder: Secondary | ICD-10-CM | POA: Diagnosis not present

## 2015-10-05 MED ORDER — BUPROPION HCL ER (XL) 300 MG PO TB24: 300.0000 mg | ORAL_TABLET | Freq: Every day | ORAL | Status: DC

## 2015-10-05 NOTE — Progress Notes (Signed)
    Daily Group Progress Note  Program: IOP  Group Time: 9:00-12:00  Participation Level: Active  Behavioral Response: Appropriate  Type of Therapy:  Group Therapy  Summary of Progress: Pt. Smiled appropriately, engaged in the group process. Pt. Met with psychiatrist regarding concerns about medication. Pt. Shared her history of general anxiety and OCD symptoms. Pt. Shared that she often compares herself to others and that this is a frequent trigger for her depressed mood.       Nancie Neas, LPC

## 2015-10-05 NOTE — Progress Notes (Signed)
Psychiatric Initial Adult Assessment   Patient Identification: Margaret Marshall MRN:  [id number] Date of Evaluation:  10/05/2015 Referral Source: self Chief Complaint:   Visit Diagnosis:    ICD-9-CM ICD-10-CM   1. GAD (generalized anxiety disorder) 300.02 F41.1 buPROPion (WELLBUTRIN XL) 300 MG 24 hr tablet    History of Present Illness:  This patient goes by the name Margaret Marshall and identifies as a female.  He has been depressed off and on for many years but the combination of buproprion and aripiprazole have helped with the depression.  Currently anxiety is th major issue.  He says he worries in an obsessive way about his future and cannot make decisions.  No current stressors otherwise.  Feels a bit lost and undirected in life.  Childhood was uneventful, no history of abuse.    Associated Signs/Symptoms: Depression Symptoms:  anxiety, (Hypo) Manic Symptoms:  none Anxiety Symptoms:  Excessive Worry, Psychotic Symptoms:  none PTSD Symptoms: Negative  Past Psychiatric History: none reported to me other than outpatient therapy  Previous Psychotropic Medications: Yes   Substance Abuse History in the last 12 months:  No.  Consequences of Substance Abuse: Negative  Past Medical History:  Past Medical History  Diagnosis Date  . Mood disorder (HCC)   . Anxiety   . Depression    No past surgical history on file.  Family Psychiatric History: no issues  Family History:  Family History  Problem Relation Age of Onset  . Bipolar disorder Father   . Depression Mother   . Depression Sister   . Depression Brother   . Alcohol abuse Maternal Uncle     Social History:   Social History   Social History  . Marital Status: Single    Spouse Name: N/A  . Number of Children: N/A  . Years of Education: N/A   Social History Main Topics  . Smoking status: Never Smoker   . Smokeless tobacco: Never Used  . Alcohol Use: No     Comment: Rare  . Drug Use: No  . Sexual Activity: Not Currently     Birth Control/ Protection: None   Other Topics Concern  . None   Social History Narrative    Additional Social History: none  Allergies:  No Known Allergies  Metabolic Disorder Labs: Lab Results  Component Value Date   HGBA1C 5.4 07/19/2015   MPG 108 07/19/2015   Lab Results  Component Value Date   PROLACTIN 15.6 08/13/2015   PROLACTIN 81.5* 07/19/2015   Lab Results  Component Value Date   CHOL 186* 08/13/2015   TRIG 74 08/13/2015   HDL 56 08/13/2015   CHOLHDL 3.3 08/13/2015   VLDL 15 08/13/2015   LDLCALC 115* 08/13/2015   LDLCALC 103* 07/19/2015     Current Medications: Current Outpatient Prescriptions  Medication Sig Dispense Refill  . ARIPiprazole (ABILIFY) 10 MG tablet Take 0.5 tablets (5 mg total) by mouth daily. 15 tablet 2  . buPROPion (WELLBUTRIN XL) 300 MG 24 hr tablet Take 1 tablet (300 mg total) by mouth daily. 90 tablet 0   No current facility-administered medications for this visit.    Neurologic: Headache: Negative Seizure: Negative Paresthesias:Negative  Musculoskeletal: Strength & Muscle Tone: within normal limits Gait & Station: normal Patient leans: N/A  Psychiatric Specialty Exam: ROS  There were no vitals taken for this visit.There is no weight on file to calculate BMI.  General Appearance: Well Groomed  Eye Contact:  Good  Speech:  Clear and Coherent  Volume:  Normal  Mood:  Anxious  Affect:  Congruent  Thought Process:  Coherent and Logical  Orientation:  Full (Time, Place, and Person)  Thought Content:  Negative  Suicidal Thoughts:  No  Homicidal Thoughts:  No  Memory:  Immediate;   Good Recent;   Good Remote;   Good  Judgement:  Intact  Insight:  Fair  Psychomotor Activity:  Normal  Concentration:  Good  Recall:  Good  Fund of Knowledge:Good  Language: Good  Akathisia:  Negative  Handed:  Right  AIMS (if indicated):  0  Assets:  Communication Skills Desire for Improvement Financial  Resources/Insurance Housing Leisure Time Physical Health Resilience Social Support Talents/Skills Transportation Vocational/Educational  ADL's:  Intact  Cognition: WNL  Sleep:  No problem    Treatment Plan Summary: daily group therapy   Carolanne Grumbling, MD 4/11/201710:17 AM

## 2015-10-06 ENCOUNTER — Other Ambulatory Visit (HOSPITAL_COMMUNITY): Payer: Managed Care, Other (non HMO) | Admitting: Psychiatry

## 2015-10-06 ENCOUNTER — Telehealth (HOSPITAL_COMMUNITY): Payer: Self-pay

## 2015-10-06 DIAGNOSIS — F332 Major depressive disorder, recurrent severe without psychotic features: Secondary | ICD-10-CM

## 2015-10-06 DIAGNOSIS — F411 Generalized anxiety disorder: Secondary | ICD-10-CM | POA: Diagnosis not present

## 2015-10-06 MED ORDER — ARIPIPRAZOLE 10 MG PO TABS: 5.0000 mg | ORAL_TABLET | Freq: Every day | ORAL | Status: DC

## 2015-10-06 NOTE — Telephone Encounter (Signed)
Patient is in group and seeing Dr. Ladona Ridgelaylor - he refilled her Aripiprazole

## 2015-10-06 NOTE — Telephone Encounter (Signed)
Patient needs a refill on Aripiprazole, she does not currently have a follow up appointment - she is in group. Patient will need a 90 day supply for her insurance to cover. I will let patient know today that she needs to make a follow up with you. She was last seen on 2/21. Okay to refill? Please advise, thank you

## 2015-10-07 ENCOUNTER — Other Ambulatory Visit (HOSPITAL_COMMUNITY): Payer: Managed Care, Other (non HMO) | Admitting: Psychiatry

## 2015-10-07 DIAGNOSIS — F332 Major depressive disorder, recurrent severe without psychotic features: Secondary | ICD-10-CM

## 2015-10-07 DIAGNOSIS — F411 Generalized anxiety disorder: Secondary | ICD-10-CM | POA: Diagnosis not present

## 2015-10-07 NOTE — Progress Notes (Signed)
    Daily Group Progress Note  Program: IOP  Group Time: 9:00-12:00  Participation Level: Minimal  Behavioral Response: Appropriate  Type of Therapy:  Group Therapy/Psychoeducation  Summary of Progress: Pt. Presented as quiet, more withdrawn than yesterday's group. Pt. Shared with the group that she felt tired and had not slept well last night. Pt. Attributed poor sleep to use of electronics i.e., cell phone while she was in bed. Pt. Participated in discussion about developing sleep hygiene and limiting use of electronics one hour before bed time and creating boundaries around place and time of sleep. Pt. Inquired about support groups for transgender people.      Shaune PollackBrown, Jennifer B, LPC

## 2015-10-07 NOTE — Progress Notes (Signed)
    Daily Group Progress Note  Program: IOP  Group Time: 9:00-10:30  Participation Level: Active  Behavioral Response: Appropriate  Type of Therapy:  Group Therapy  Summary of Progress: Pt. Presents as quiet, sometimes appears to be distracted from the group process. Pt. Discussed her fears of wanting to make decisions that are good for her and balancing against her fears that she will anger or displease members of her family.     Group Time: 10:30-12:00  Participation Level:  Active  Behavioral Response: Appropriate  Type of Therapy: Psycho-education Group  Summary of Progress: Pt. Participated in discussion about developing healthy relationship boundaries and discussed the boundaries worksheet distinguishing between compliant, controlling, and avoidant personality types.  Shaune PollackBrown, Jennifer B, LPC

## 2015-10-08 ENCOUNTER — Other Ambulatory Visit (HOSPITAL_COMMUNITY): Payer: Managed Care, Other (non HMO)

## 2015-10-11 ENCOUNTER — Other Ambulatory Visit (HOSPITAL_COMMUNITY): Payer: Managed Care, Other (non HMO) | Admitting: Psychiatry

## 2015-10-11 DIAGNOSIS — F411 Generalized anxiety disorder: Secondary | ICD-10-CM | POA: Diagnosis not present

## 2015-10-11 DIAGNOSIS — F332 Major depressive disorder, recurrent severe without psychotic features: Secondary | ICD-10-CM

## 2015-10-11 NOTE — Progress Notes (Signed)
    Daily Group Progress Note  Program: IOP  Group Time: 9:00-12:00  Participation Level: Active  Behavioral Response: Appropriate  Type of Therapy:  Group Therapy  Summary of Progress: Pt. Presents as quiet, withdrawn and depressed. Pt. Is cooperative and responsive in the group process. Pt. Reported that she was "doing good", that she did not do much over the long Easter weekend. Pt. Shared that she saw a friend and spent time with the friend for a while. Pt. Shared that she felt good on her medications and that her depression had not worsened.      Shaune PollackBrown, Karrah Mangini B, LPC

## 2015-10-12 ENCOUNTER — Other Ambulatory Visit (HOSPITAL_COMMUNITY): Payer: Managed Care, Other (non HMO) | Admitting: Psychiatry

## 2015-10-12 DIAGNOSIS — F411 Generalized anxiety disorder: Secondary | ICD-10-CM | POA: Diagnosis not present

## 2015-10-12 DIAGNOSIS — F332 Major depressive disorder, recurrent severe without psychotic features: Secondary | ICD-10-CM

## 2015-10-12 NOTE — Progress Notes (Signed)
    Daily Group Progress Note  Program: IOP  Group Time: 9:00-12:00  Participation Level: Active  Behavioral Response: Appropriate  Type of Therapy:  Group Therapy/Psychoeducation  Summary of Progress: Pt. Continues to present as quiet and somewhat guarded. Pt. Shared with the group that she was quiet because she was recovering from a sinus infection. Pt. Shared with the group that she worries about her future. Pt. Shared that she has four older siblings whom she regards as highly successful and she feels pressure from them to figure out her life, start her career, and become financially independent. Pt. Identified with feelings of inadequacy caused by comparison of herself to the accomplishments of her siblings. Pt. Received feedback from the group regarding setting boundaries around her communications with her family by validating their concern for her, but explaining that their suggestions for her future were appreciated but not helpful at this time. Pt. Shared her fears of upsetting her siblings by setting boundaries with them. Pt. Watched and discussed Dewain PenningBrene Brown video about developing vulnerability to help develop resilience to shame.     Shaune PollackBrown, Jennifer B, LPC

## 2015-10-13 ENCOUNTER — Other Ambulatory Visit (HOSPITAL_COMMUNITY): Payer: Managed Care, Other (non HMO) | Admitting: Psychiatry

## 2015-10-13 DIAGNOSIS — F411 Generalized anxiety disorder: Secondary | ICD-10-CM | POA: Diagnosis not present

## 2015-10-13 DIAGNOSIS — F332 Major depressive disorder, recurrent severe without psychotic features: Secondary | ICD-10-CM

## 2015-10-13 NOTE — Progress Notes (Signed)
    Daily Group Progress Note  Program: IOP  Group Time: 9:00-12:00  Participation Level: Active  Behavioral Response: Appropriate  Type of Therapy:  Group Therapy/Psychoeducation  Summary of Progress: Pt. Continues to present as withdrawn and depressed. Pt. Is cooperative and responsive in group, but is hesitant to talk about his concerns without significant prompting. Pt. Reported that he was doing "ok". Pt. Discussed current job as a Journalist, newspapercashier in retail and does not like the job because of poor Insurance account managermanagement. He stated that he is actively looking for a new job.      Shaune PollackBrown, Georganna Maxson B, LPC

## 2015-10-14 ENCOUNTER — Other Ambulatory Visit (HOSPITAL_COMMUNITY): Payer: Managed Care, Other (non HMO) | Admitting: Psychiatry

## 2015-10-14 DIAGNOSIS — F332 Major depressive disorder, recurrent severe without psychotic features: Secondary | ICD-10-CM

## 2015-10-14 DIAGNOSIS — F411 Generalized anxiety disorder: Secondary | ICD-10-CM | POA: Diagnosis not present

## 2015-10-14 NOTE — Progress Notes (Signed)
Patient ID: Margaret Norrisnna Santana, female   DOB: 06/12/1996, 20 y.o.   MRN: [id number] Discharge Note  Patient:  Margaret Marshall is an 20 y.o., female DOB:  01/11/1996  Date of Admission:  10/05/2015  Date of Discharge: 10/15/2015  Reason for Admission:anxiety   IOP Course:  Mr Corine ShelterWatkins attended and participated.  He did not talk much in group but said it was useful to hear others and he applied what they talked about and learned to his own situation.  Feels much less anxious.  Says he really does not need an early appointment with his psychiatrist as meds are working okay.  Mental Status at Discharge:no suicidal thoughts.  Anxiety mild and manageable.  Lab Results: No results found for this or any previous visit (from the past 48 hour(s)).   Current outpatient prescriptions:  .  ARIPiprazole (ABILIFY) 10 MG tablet, Take 0.5 tablets (5 mg total) by mouth daily., Disp: 15 tablet, Rfl: 2 .  buPROPion (WELLBUTRIN XL) 300 MG 24 hr tablet, Take 1 tablet (300 mg total) by mouth daily., Disp: 90 tablet, Rfl: 0  Axis Diagnosis:  Generalized anxiety disorder   Level of Care:  IOP  Discharge destination:  Other:  has appointment with psychiatrist and therapist  Is patient on multiple antipsychotic therapies at discharge:  No    Has Patient had three or more failed trials of antipsychotic monotherapy by history:  Negative  Patient phone:  [phone] (home)  [place]   [address] [place] [id number],   Follow-up recommendations:  Activity:  continue current activity Diet:  continue current diet  Comments:  none  The patient received suicide prevention pamphlet:  Yes   Carolanne GrumblingGerald Annell Canty 10/14/2015, 9:37 AM

## 2015-10-15 ENCOUNTER — Other Ambulatory Visit (HOSPITAL_COMMUNITY): Payer: Managed Care, Other (non HMO) | Admitting: Psychiatry

## 2015-10-15 DIAGNOSIS — F411 Generalized anxiety disorder: Secondary | ICD-10-CM | POA: Diagnosis not present

## 2015-10-15 DIAGNOSIS — F332 Major depressive disorder, recurrent severe without psychotic features: Secondary | ICD-10-CM

## 2015-10-15 NOTE — Patient Instructions (Signed)
Patient completed MH-IOP today.  Will follow up with Dr. Rutherford Limerickadepalli on 10-21-15 @ 1 pm and pt will schedule an appointment with Dallie Dadeleste Nettles, LPC.  Encouraged support groups.

## 2015-10-15 NOTE — Progress Notes (Signed)
Margaret Marshall is a 20 yr old, single, employed, Caucasian female who was referred per PHP. Pt had called a couple of months ago about starting a group and this [facility] Apparently, pt didn't follow through with starting PHP. According to pt, her mother is pushing her to attend a group. Pt stated she has been struggling with severe anxiety for a couple of years. No apparent stressors/triggers . "I'm just wondering what to do with my life. What's my purpose." Pt is transgender (from female to female). Prefers to be called "Margaret Marshall." Stated she started questioning her identity at age 59seventeen. Reports family is supportive; although mother had a difficult time at first. [facility] and [facility]. Has been seeing Dr. Rutherford Limerickadepalli [facility]. Denied prior suicide attempts. Admits to hx of self-injurious behaviors (cutting left arm); recent episode was 3 1/2 months ago. Family hx: Mother and Siblings (Depression); Maternal Uncle (ETOH)  Pt completed MH-IOP today.  Reports decreased anxiety, but continues to struggle with self esteem issues.  States she didn't disclose gender with the group.  "I just decided not to because there are a few very religious women in the group and I just didn't know how they would feel about transgender."  Pt states she did learn some tools that she could apply.  Denies SI/HI or A/V.  A:  D/C today.  F/U with Dr. Rutherford Limerickadepalli on 10-21-15 @ 1pm and pt will schedule an appt with Dallie Dadeleste Nettles, LPC.  Encouraged support groups.  R:  Pt receptive.     Chestine SporeLARK, RITA, M.Ed, CNA

## 2015-10-15 NOTE — Progress Notes (Signed)
    Daily Group Progress Note  Program: IOP  Group Time: 9:00-12:00  Participation Level: Active  Behavioral Response: Appropriate  Type of Therapy:  Group Therapy  Summary of Progress: Pt. Continues to present as quiet, guarded in group, however, cooperative and responsive to questions from the group. Pt. Shared that she felt energetic about cleaning and preparing for her mother's house guests . Pt. 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 Participated in discussion about mindfulness, use of meditation and walking meditation, and guided meditation-body scan exercise.       Shaune PollackBrown, Mahamud Metts B, LPC

## 2015-10-18 ENCOUNTER — Other Ambulatory Visit (HOSPITAL_COMMUNITY): Payer: Managed Care, Other (non HMO)

## 2015-10-18 NOTE — Progress Notes (Signed)
    Daily Group Progress Note  Program: IOP  Group Time: 9:00-12:00  Participation Level: Active  Behavioral Response: Appropriate  Type of Therapy:  Group Therapy  Summary of Progress: Pt. Continues to present as quiet and somewhat guarded. Pt. Reported to the group that he was "doing good". Watched and discussed Mel Robbins video about developing activation energy to begin difficult tasks. Pt. Participated in grief and loss group lead by the Chaplain.      Shaune PollackBrown, Jennifer B, LPC

## 2015-10-19 ENCOUNTER — Other Ambulatory Visit (HOSPITAL_COMMUNITY): Payer: Managed Care, Other (non HMO)

## 2015-10-20 ENCOUNTER — Other Ambulatory Visit (HOSPITAL_COMMUNITY): Payer: Managed Care, Other (non HMO)

## 2015-10-21 ENCOUNTER — Other Ambulatory Visit (HOSPITAL_COMMUNITY): Payer: Managed Care, Other (non HMO)

## 2015-10-21 ENCOUNTER — Ambulatory Visit (HOSPITAL_COMMUNITY): Payer: Self-pay | Admitting: Psychiatry

## 2015-10-22 ENCOUNTER — Other Ambulatory Visit (HOSPITAL_COMMUNITY): Payer: Managed Care, Other (non HMO)

## 2015-10-25 ENCOUNTER — Ambulatory Visit (HOSPITAL_COMMUNITY): Payer: Self-pay | Admitting: Psychiatry

## 2015-10-25 ENCOUNTER — Other Ambulatory Visit (HOSPITAL_COMMUNITY): Payer: Managed Care, Other (non HMO)

## 2015-10-26 ENCOUNTER — Other Ambulatory Visit (HOSPITAL_COMMUNITY): Payer: Managed Care, Other (non HMO)

## 2015-10-27 ENCOUNTER — Other Ambulatory Visit (HOSPITAL_COMMUNITY): Payer: Managed Care, Other (non HMO)

## 2015-10-28 ENCOUNTER — Other Ambulatory Visit (HOSPITAL_COMMUNITY): Payer: Managed Care, Other (non HMO)

## 2015-10-29 ENCOUNTER — Other Ambulatory Visit (HOSPITAL_COMMUNITY): Payer: Managed Care, Other (non HMO)

## 2015-11-01 ENCOUNTER — Other Ambulatory Visit (HOSPITAL_COMMUNITY): Payer: Managed Care, Other (non HMO)

## 2015-11-02 ENCOUNTER — Other Ambulatory Visit (HOSPITAL_COMMUNITY): Payer: Managed Care, Other (non HMO)

## 2015-11-03 ENCOUNTER — Other Ambulatory Visit (HOSPITAL_COMMUNITY): Payer: Managed Care, Other (non HMO)

## 2015-11-04 ENCOUNTER — Other Ambulatory Visit (HOSPITAL_COMMUNITY): Payer: Managed Care, Other (non HMO)

## 2015-11-05 ENCOUNTER — Other Ambulatory Visit (HOSPITAL_COMMUNITY): Payer: Managed Care, Other (non HMO)

## 2015-11-08 ENCOUNTER — Other Ambulatory Visit (HOSPITAL_COMMUNITY): Payer: Managed Care, Other (non HMO)

## 2015-11-09 ENCOUNTER — Other Ambulatory Visit (HOSPITAL_COMMUNITY): Payer: Managed Care, Other (non HMO)

## 2015-11-10 ENCOUNTER — Other Ambulatory Visit (HOSPITAL_COMMUNITY): Payer: Managed Care, Other (non HMO)

## 2015-11-11 ENCOUNTER — Other Ambulatory Visit (HOSPITAL_COMMUNITY): Payer: Managed Care, Other (non HMO)

## 2015-11-12 ENCOUNTER — Other Ambulatory Visit (HOSPITAL_COMMUNITY): Payer: Managed Care, Other (non HMO)

## 2015-11-15 ENCOUNTER — Other Ambulatory Visit (HOSPITAL_COMMUNITY): Payer: Managed Care, Other (non HMO)

## 2015-11-16 ENCOUNTER — Other Ambulatory Visit (HOSPITAL_COMMUNITY): Payer: Managed Care, Other (non HMO)

## 2015-11-17 ENCOUNTER — Other Ambulatory Visit (HOSPITAL_COMMUNITY): Payer: Managed Care, Other (non HMO)

## 2015-11-18 ENCOUNTER — Other Ambulatory Visit (HOSPITAL_COMMUNITY): Payer: Managed Care, Other (non HMO)

## 2015-11-19 ENCOUNTER — Other Ambulatory Visit (HOSPITAL_COMMUNITY): Payer: Managed Care, Other (non HMO)

## 2015-11-23 ENCOUNTER — Other Ambulatory Visit (HOSPITAL_COMMUNITY): Payer: Managed Care, Other (non HMO)

## 2015-11-24 ENCOUNTER — Other Ambulatory Visit (HOSPITAL_COMMUNITY): Payer: Managed Care, Other (non HMO)

## 2015-11-25 ENCOUNTER — Other Ambulatory Visit (HOSPITAL_COMMUNITY): Payer: Managed Care, Other (non HMO)

## 2015-11-26 ENCOUNTER — Other Ambulatory Visit (HOSPITAL_COMMUNITY): Payer: Managed Care, Other (non HMO)

## 2015-11-29 ENCOUNTER — Other Ambulatory Visit (HOSPITAL_COMMUNITY): Payer: Managed Care, Other (non HMO)

## 2015-11-30 ENCOUNTER — Other Ambulatory Visit (HOSPITAL_COMMUNITY): Payer: Managed Care, Other (non HMO)

## 2016-01-18 ENCOUNTER — Telehealth (HOSPITAL_COMMUNITY): Payer: Self-pay | Admitting: Psychiatry

## 2016-01-18 DIAGNOSIS — F332 Major depressive disorder, recurrent severe without psychotic features: Secondary | ICD-10-CM

## 2016-01-18 DIAGNOSIS — F411 Generalized anxiety disorder: Secondary | ICD-10-CM

## 2016-01-18 MED ORDER — ARIPIPRAZOLE 10 MG PO TABS: 5.0000 mg | ORAL_TABLET | Freq: Every day | ORAL | 2 refills | Status: DC

## 2016-01-18 MED ORDER — BUPROPION HCL ER (XL) 300 MG PO TB24
300.0000 mg | ORAL_TABLET | Freq: Every day | ORAL | 0 refills | Status: DC
Start: 2016-01-18 — End: 2016-06-07

## 2016-01-18 NOTE — Telephone Encounter (Signed)
meds working well.  Has not been able to keep appointments due to interfering with her work which she needs to do she says. Does have an appointment set up.

## 2016-02-01 DIAGNOSIS — H5210 Myopia, unspecified eye: Secondary | ICD-10-CM | POA: Diagnosis not present

## 2016-02-23 ENCOUNTER — Telehealth (HOSPITAL_COMMUNITY): Payer: Self-pay

## 2016-02-23 NOTE — Telephone Encounter (Signed)
PT l/m on v/mail stating she needed a new pt appt. PT is already scheduled on 03/14/16 w/ Dr. Alena BillsAgrawal. I called pt back and left the time/date on pt's v/mail.

## 2016-03-14 ENCOUNTER — Ambulatory Visit (HOSPITAL_COMMUNITY): Payer: Self-pay | Admitting: Psychiatry

## 2016-04-12 DIAGNOSIS — Z0000 Encounter for general adult medical examination without abnormal findings: Secondary | ICD-10-CM | POA: Diagnosis not present

## 2016-04-12 DIAGNOSIS — R69 Illness, unspecified: Secondary | ICD-10-CM | POA: Diagnosis not present

## 2016-05-08 DIAGNOSIS — R69 Illness, unspecified: Secondary | ICD-10-CM | POA: Diagnosis not present

## 2016-05-08 DIAGNOSIS — E281 Androgen excess: Secondary | ICD-10-CM | POA: Diagnosis not present

## 2016-06-07 ENCOUNTER — Other Ambulatory Visit (HOSPITAL_COMMUNITY): Payer: Self-pay

## 2016-06-07 DIAGNOSIS — F411 Generalized anxiety disorder: Secondary | ICD-10-CM

## 2016-06-07 MED ORDER — BUPROPION HCL ER (XL) 300 MG PO TB24: 300.0000 mg | ORAL_TABLET | Freq: Every day | ORAL | 0 refills | Status: DC

## 2016-06-15 DIAGNOSIS — F341 Dysthymic disorder: Secondary | ICD-10-CM | POA: Diagnosis not present

## 2016-06-15 DIAGNOSIS — R69 Illness, unspecified: Secondary | ICD-10-CM | POA: Diagnosis not present

## 2016-06-22 ENCOUNTER — Encounter (HOSPITAL_COMMUNITY): Payer: Self-pay | Admitting: Psychiatry

## 2016-06-22 ENCOUNTER — Ambulatory Visit (INDEPENDENT_AMBULATORY_CARE_PROVIDER_SITE_OTHER): Payer: Managed Care, Other (non HMO) | Admitting: Psychiatry

## 2016-06-22 VITALS — BP 120/68 | HR 100 | Ht 67.0 in | Wt 131.6 lb

## 2016-06-22 DIAGNOSIS — F411 Generalized anxiety disorder: Secondary | ICD-10-CM | POA: Diagnosis not present

## 2016-06-22 DIAGNOSIS — F4010 Social phobia, unspecified: Secondary | ICD-10-CM

## 2016-06-22 DIAGNOSIS — F332 Major depressive disorder, recurrent severe without psychotic features: Secondary | ICD-10-CM | POA: Diagnosis not present

## 2016-06-22 DIAGNOSIS — Z818 Family history of other mental and behavioral disorders: Secondary | ICD-10-CM | POA: Diagnosis not present

## 2016-06-22 DIAGNOSIS — R69 Illness, unspecified: Secondary | ICD-10-CM | POA: Diagnosis not present

## 2016-06-22 DIAGNOSIS — Z811 Family history of alcohol abuse and dependence: Secondary | ICD-10-CM

## 2016-06-22 MED ORDER — BUPROPION HCL ER (XL) 300 MG PO TB24: 300.0000 mg | ORAL_TABLET | Freq: Every day | ORAL | 2 refills | Status: DC

## 2016-06-22 MED ORDER — HYDROXYZINE HCL 10 MG PO TABS: 10.0000 mg | ORAL_TABLET | Freq: Two times a day (BID) | ORAL | 2 refills | Status: DC | PRN

## 2016-06-22 MED ORDER — ARIPIPRAZOLE 5 MG PO TABS
5.0000 mg | ORAL_TABLET | Freq: Every day | ORAL | 2 refills | Status: DC
Start: 2016-06-22 — End: 2016-08-24

## 2016-06-22 NOTE — Progress Notes (Signed)
[facility] progress note  Patient Identification: Margaret Marshall MRN:  [id number] Date of Evaluation:  06/22/2016  Subjective- "Anxiety is worse"  Visit Diagnosis:    ICD-9-CM ICD-10-CM   1. Social anxiety disorder 300.23 F40.10 hydrOXYzine (ATARAX/VISTARIL) 10 MG tablet  2. MDD (major depressive disorder), recurrent severe, without psychosis (HCC) 296.33 F33.2 ARIPiprazole (ABILIFY) 5 MG tablet  3. GAD (generalized anxiety disorder) 300.02 F41.1      Diagnosis:   Patient Active Problem List   Diagnosis Date Noted  . GAD (generalized anxiety disorder) [F41.1] 08/03/2015  . Gender dysphoria [IMO0002] 07/19/2015  . MDD (major depressive disorder), recurrent severe, without psychosis (HCC) [F33.2] 07/18/2015   History of Present Illness: -- Pt states her social anxiety has gotten worse recently. She has had 2 stress induced panic attacks. Reports Buspar BID is not helping. She has been taking it for about 6 weeks. Pt has a few friends who she rarely see's. Pt best friend lives in [place]. Pt finds it difficult to make new friends. Pt is avoiding social gatherings whenever possible. Margaret Marshall worries about everything she is doing and say. She is uncomfortable and thinks everyone is judging her.    Pt tooks Abilify for a few months then stopped. States she tolerated it well.   States depression is much better. Pt goes for therapy once every 2 weeks and it is helping a lot. Pt feels down a couple days a week. Pt treats by distracting herself. She has on/off anhedonia. Denies crying spells, worthlessness and hopelessness. Denies SI/HI. Wellbutrin is helping to improve her depression.   Pt has had 3 testosterone shots so far. She is tolerating it well and is happy with the changes.   It takes a while to fall asleep and pt is getting about 7 hrs/night. Energy and appetite are good.   Taking meds as prescribed and denies SE.                                                                          Notes from her initial visit with Dr. Rutherford Limerickadepalli  20 year old white female [facility] care. [facility] to 07/21/2015. Patient had become depressed and had suicidal ideation with a plan to overdose on her medications at that time which included Wellbutrin, Klonopin, Risperdal and Lamictal. Patient stated she had multiple stressors which include gender dysphoria patient is a female that wants to change into a female and cannot afford the treatment. She is very stressed about this also has a history of cutting had been feeling hopeless and helpless prior to admission. In addition to that she works part-time at [employer] and lives with her mother and the parents are divorced and she used to be on her father's insurance and dad lost his job and so now she is on her [employer] insurance. On the inpatient unit her Lamictal and Klonopin were discontinued. She was continued on Wellbutrin XL 150 mg daily, Risperdal is 1 mg daily at bedtime and was put on Vistaril 25 mg every 6 hours and trazodone 50 mg daily at bedtime.  Elevated labs at that time included high prolactin and a high  TSH.  Since discharge from the hospital patient states that her sleep is good appetite tends to fluctuate she snacks a lot, mood is improving she still anxious and they're in the process of tapering the Klonopin and discontinuing it she is presently on Klonopin 0.25 mg for another week and then will discontinue it. She is also being tapered off the Risperdal she is currently on Risperdal 0.5 mg daily at bedtime for 1 week and then this will decrease to 0.25 mg for another week and discontinued. Patient is experienced no difficulties with the taper.  States that she is in a relationship, has never been sexually active and her last menstrual period was several months ago.  Previous Psychotropic Medications: Yes   Substance Abuse  History in the last 12 months:  No.  Consequences of Substance Abuse: NA  Past psychiatric history-  patient was hospitalized on [facility] H inpatient unit from 07/18/2015 through 07/21/2015.                                           She has seen Dr. Toni Arthurs on an outpatient basis and has also seen a therapist keep Margaret Marshall.   Past Medical History: As listed Past Medical History:  Diagnosis Date  . Anxiety   . Depression   . Mood disorder [facility])     Past Surgical History:  Procedure Laterality Date  . NO PAST SURGERIES         reviewed information below with patient on 06/22/16  and same as previous visits except as noted Family History: Dad has ADHD and bipolar disorder., 3 siblings have depression and anxiety, maternal grandfather has alcohol problems, maternal uncle has drug problems. Family History  Problem Relation Age of Onset  . Depression Mother   . Depression Sister   . Depression Brother   . Alcohol abuse Maternal Uncle   . Bipolar disorder Father   . ADD / ADHD Father    Social History:  Social History   Social History  . Marital status: Single    Spouse name: N/A  . Number of children: N/A  . Years of education: N/A   Social History Main Topics  . Smoking status: Never Smoker  . Smokeless tobacco: Never Used  . Alcohol use No     Comment: Rare  . Drug use: No  . Sexual activity: Not Currently    Birth control/ protection: None   Other Topics Concern  . None   Social History Narrative   Living in [place] with mom. Born and raised in [place] by parents. Pt has 4 older siblings. Pt has graduated HS and did one semester of online college. Pt is working part time in retail. Single, no kids.   Denies any legal problems in the past     Musculoskeletal: Strength & Muscle Tone: within normal limits Gait & Station: normal Patient leans: stands straight  Psychiatric Specialty Exam: reviewed MSE on 06/22/16  and same as previous visits except as  noted  Depression         Associated symptoms include does not have insomnia, no headaches and no suicidal ideas.  Past medical history includes anxiety.   Anxiety  Symptoms include nervous/anxious behavior. Patient reports no dizziness, insomnia or suicidal ideas.    Medication Refill  Pertinent negatives include no headaches or neck pain.    Review of Systems  Musculoskeletal:  Negative for back pain, falls, joint pain and neck pain.  Neurological: Negative for dizziness, tingling, tremors, seizures, loss of consciousness and headaches.  Psychiatric/Behavioral: Positive for depression. Negative for hallucinations, substance abuse and suicidal ideas. The patient is nervous/anxious. The patient does not have insomnia.   All other systems reviewed and are negative.   Blood pressure 120/68, pulse 100, height 5\' 7"  (1.702 m), weight 131 lb 9.6 oz (59.7 kg).Body mass index is 20.61 kg/m.  General Appearance: Casual  Eye Contact:  Minimal  Speech:  Clear and Coherent and Normal Rate  Volume:  Decreased  Mood:   Mildly anxious  Affect:   appropriate  Thought Process:  Goal Directed, Linear and Logical  Orientation:  Full (Time, Place, and Person)  Thought Content:   WDL  Suicidal Thoughts:  No  Homicidal Thoughts:  No  Memory:  Immediate;   Good Recent;   Good Remote;   Good  Judgement:  Intact  Insight:  Present  Psychomotor Activity:  Normal  Concentration:  Fair  Recall:  Good  Fund of Knowledge:Good  Language: Good  Akathisia:  No  Handed:  Right  AIMS (if indicated):  0  Assets:  Communication Skills Desire for Improvement Financial Resources/Insurance Housing Physical Health Resilience Social Support  ADL's:  Intact  Cognition: WNL  Sleep:  Fair    Is the patient at risk to self?  No. Has the patient been a risk to self in the past 6 months?  Yes.   Has the patient been a risk to self within the distant past?  No. Is the patient a risk to others?  No. Has the  patient been a risk to others in the past 6 months?  No. Has the patient been a risk to others within the distant past?  No.  Allergies:  No Known Allergies Current Medications: Current Outpatient Prescriptions  Medication Sig Dispense Refill  . buPROPion (WELLBUTRIN XL) 300 MG 24 hr tablet Take 1 tablet (300 mg total) by mouth daily. 30 tablet 0  . busPIRone (BUSPAR) 15 MG tablet Take 15 mg by mouth 2 (two) times daily.     Marland Kitchen testosterone cypionate (DEPOTESTOSTERONE CYPIONATE) 200 MG/ML injection Inject 120 mg into the muscle.    . ARIPiprazole (ABILIFY) 10 MG tablet Take 0.5 tablets (5 mg total) by mouth daily. (Patient not taking: Reported on 06/22/2016) 15 tablet 2   No current facility-administered medications for this visit.      Reviewed A&P below with patient on 06/22/16  and same as previous visits except as noted  Treatment Plan Summary: Medication management #1 Maj. depressive disorder severe without psychotic features.  continue Wellbutrin XL 300 mg by mouth every morning.  restart Abilify 5 mg by mouth every at bedtime   #2 generalized anxiety disorder vs Social anxiety disorder. Relaxation techniques and deep breathing was discussed in detail with the patient. D/c Buspar Start trial of Vistaril 10mg  po BID prn anxiety  #3 gender dysphoria Patient will continue with her therapist regarding this.  #4 labs 08/13/2015  CBC, CMP, TSH T4 and prolactin level was normal and  her lipid panel was elevated discussed a healthy diet patient stated understanding. EKG 07/19/2015 EKG NRS, QTc 421  #5 therapy [facility].  Pt denies SI and is at an acute low risk for suicide.Patient told to call clinic if any problems occur. Patient advised to go to ER if they should develop SI/HI, side effects, or if symptoms  worsen. Has crisis numbers to call if needed. Pt verbalized understanding.  F/up in 2 months or sooner if needed   Oletta DarterSalina  Georgana Romain 12/28/201710:03 AM

## 2016-06-29 DIAGNOSIS — F341 Dysthymic disorder: Secondary | ICD-10-CM | POA: Diagnosis not present

## 2016-06-29 DIAGNOSIS — R69 Illness, unspecified: Secondary | ICD-10-CM | POA: Diagnosis not present

## 2016-07-06 DIAGNOSIS — F341 Dysthymic disorder: Secondary | ICD-10-CM | POA: Diagnosis not present

## 2016-07-06 DIAGNOSIS — R69 Illness, unspecified: Secondary | ICD-10-CM | POA: Diagnosis not present

## 2016-07-06 DIAGNOSIS — F640 Transsexualism: Secondary | ICD-10-CM | POA: Diagnosis not present

## 2016-07-07 ENCOUNTER — Other Ambulatory Visit (HOSPITAL_COMMUNITY): Payer: Self-pay

## 2016-07-07 MED ORDER — BUPROPION HCL ER (XL) 300 MG PO TB24: 300.0000 mg | ORAL_TABLET | Freq: Every day | ORAL | 0 refills | Status: DC

## 2016-07-11 DIAGNOSIS — E281 Androgen excess: Secondary | ICD-10-CM | POA: Diagnosis not present

## 2016-07-14 ENCOUNTER — Telehealth (HOSPITAL_COMMUNITY): Payer: Self-pay

## 2016-07-14 NOTE — Telephone Encounter (Signed)
Patient called and said he does not feel like the Visteril is working. He is not able to concentrate as well and he is not sleeping as well. He is not sure it is the Visteril, but the symptoms started about the same time he started the medication. Patient says his anxiety is about the same, he does not feel any improvement. Please review and advise, thank you

## 2016-07-19 DIAGNOSIS — F341 Dysthymic disorder: Secondary | ICD-10-CM | POA: Diagnosis not present

## 2016-07-19 DIAGNOSIS — R69 Illness, unspecified: Secondary | ICD-10-CM | POA: Diagnosis not present

## 2016-07-19 DIAGNOSIS — F640 Transsexualism: Secondary | ICD-10-CM | POA: Diagnosis not present

## 2016-07-27 MED ORDER — CLONIDINE HCL 0.1 MG PO TABS: 0.1000 mg | ORAL_TABLET | Freq: Two times a day (BID) | ORAL | 1 refills | Status: DC | PRN

## 2016-08-02 DIAGNOSIS — F640 Transsexualism: Secondary | ICD-10-CM | POA: Diagnosis not present

## 2016-08-02 DIAGNOSIS — F341 Dysthymic disorder: Secondary | ICD-10-CM | POA: Diagnosis not present

## 2016-08-02 DIAGNOSIS — R69 Illness, unspecified: Secondary | ICD-10-CM | POA: Diagnosis not present

## 2016-08-09 DIAGNOSIS — F640 Transsexualism: Secondary | ICD-10-CM | POA: Diagnosis not present

## 2016-08-09 DIAGNOSIS — R69 Illness, unspecified: Secondary | ICD-10-CM | POA: Diagnosis not present

## 2016-08-09 DIAGNOSIS — F341 Dysthymic disorder: Secondary | ICD-10-CM | POA: Diagnosis not present

## 2016-08-18 DIAGNOSIS — R69 Illness, unspecified: Secondary | ICD-10-CM | POA: Diagnosis not present

## 2016-08-18 DIAGNOSIS — F341 Dysthymic disorder: Secondary | ICD-10-CM | POA: Diagnosis not present

## 2016-08-18 DIAGNOSIS — F640 Transsexualism: Secondary | ICD-10-CM | POA: Diagnosis not present

## 2016-08-24 ENCOUNTER — Encounter (HOSPITAL_COMMUNITY): Payer: Self-pay | Admitting: Psychiatry

## 2016-08-24 ENCOUNTER — Ambulatory Visit (INDEPENDENT_AMBULATORY_CARE_PROVIDER_SITE_OTHER): Payer: Managed Care, Other (non HMO) | Admitting: Psychiatry

## 2016-08-24 VITALS — BP 118/82 | HR 78 | Ht 67.0 in | Wt 135.0 lb

## 2016-08-24 DIAGNOSIS — F332 Major depressive disorder, recurrent severe without psychotic features: Secondary | ICD-10-CM | POA: Diagnosis not present

## 2016-08-24 DIAGNOSIS — F4010 Social phobia, unspecified: Secondary | ICD-10-CM | POA: Diagnosis not present

## 2016-08-24 DIAGNOSIS — Z811 Family history of alcohol abuse and dependence: Secondary | ICD-10-CM

## 2016-08-24 DIAGNOSIS — Z79899 Other long term (current) drug therapy: Secondary | ICD-10-CM

## 2016-08-24 DIAGNOSIS — F649 Gender identity disorder, unspecified: Secondary | ICD-10-CM

## 2016-08-24 DIAGNOSIS — R69 Illness, unspecified: Secondary | ICD-10-CM | POA: Diagnosis not present

## 2016-08-24 DIAGNOSIS — Z818 Family history of other mental and behavioral disorders: Secondary | ICD-10-CM

## 2016-08-24 MED ORDER — ARIPIPRAZOLE 5 MG PO TABS: 5.0000 mg | ORAL_TABLET | Freq: Every day | ORAL | 0 refills | Status: DC

## 2016-08-24 MED ORDER — LORAZEPAM 0.5 MG PO TABS: 0.5000 mg | ORAL_TABLET | Freq: Every day | ORAL | 0 refills | Status: AC | PRN

## 2016-08-24 MED ORDER — BUPROPION HCL ER (XL) 300 MG PO TB24: 300.0000 mg | ORAL_TABLET | Freq: Every day | ORAL | 0 refills | Status: DC

## 2016-08-24 NOTE — Progress Notes (Signed)
[facility] progress note  Patient Identification: Margaret Marshall MRN:  [id number] Date of Evaluation:  08/24/2016  Subjective- "I have some stuff I want to talk about"  Visit Diagnosis:    ICD-9-CM ICD-10-CM   1. MDD (major depressive disorder), recurrent severe, without psychosis (HCC) 296.33 F33.2 ARIPiprazole (ABILIFY) 5 MG tablet     buPROPion (WELLBUTRIN XL) 300 MG 24 hr tablet     Diagnosis:   Patient Active Problem List   Diagnosis Date Noted  . GAD (generalized anxiety disorder) [F41.1] 08/03/2015  . Gender dysphoria [IMO0002] 07/19/2015  . MDD (major depressive disorder), recurrent severe, without psychosis (HCC) [F33.2] 07/18/2015   History of Present Illness: -- Pt states he has social anxiety but wants to confirm symptoms. He is constantly worried about others are thinking of him. Pt finds it difficult to make new friends. Pt is avoiding social gatherings whenever possible. Margaret Marshall worries about everything he is doing and say. He is uncomfortable and thinks everyone is judging him.  He has not been having any full blown panic attacks in a long while. He is not taking Clonidine as it is not helping anxiety.   States depression is present and his social anxiety feeds into it. Pt goes for therapy once every 2 weeks and it is helping a lot. Pt feels down a couple days a week. Pt treats by distracting himself. She has on/off anhedonia. Denies crying spells, worthlessness and hopelessness. Denies SI/HI. Wellbutrin and Abilify are helping to improve him depression.   Pt has had several testosterone shots so far. He is tolerating it well and is happy with the changes. He is noticing his voice is deepening and some physical changes. He is getting positive feedback from others.   Pt is getting about 7-8 hrs/night. Energy and appetite are good.   Taking meds as prescribed and denies SE.                                                                         Notes from her initial visit with  Dr. Rutherford Limerick  21 year old white female transferred from [facility] H inpatient adult unit(for outpatient care. Patient had been hospitalized on the [facility] H inpatient unit from 120-17 to 07/21/2015. Patient had become depressed and had suicidal ideation with a plan to overdose on her medications at that time which included Wellbutrin, Klonopin, Risperdal and Lamictal. Patient stated she had multiple stressors which include gender dysphoria patient is a female that wants to change into a female and cannot afford the treatment. She is very stressed about this also has a history of cutting had been feeling hopeless and helpless prior to admission. In addition to that she works part-time at [employer] and lives with her mother and the parents are divorced and she used to be on her father's insurance and dad lost his job and so now she is on her [employer]. On the inpatient unit her Lamictal and Klonopin were discontinued. She was continued on Wellbutrin XL 150 mg daily, Risperdal is 1 mg daily at bedtime and was put on Vistaril 25 mg every 6 hours and trazodone 50 mg daily at bedtime.  Elevated labs at that time included high prolactin and a high TSH.  Since discharge from the hospital patient states that her sleep is good appetite tends to fluctuate she snacks a lot, mood is improving she still anxious and they're in the process of tapering the Klonopin and discontinuing it she is presently on Klonopin 0.25 mg for another week and then will discontinue it. She is also being tapered off the Risperdal she is currently on Risperdal 0.5 mg daily at bedtime for 1 week and then this will decrease to 0.25 mg for another week and discontinued. Patient is experienced no difficulties with the taper.  States that she is in a relationship, has never been sexually active and her last menstrual period was several months ago.  Previous Psychotropic Medications: Yes   Substance Abuse History in the last 12 months:   No.  Consequences of Substance Abuse: NA  Past psychiatric history-  patient was hospitalized on [facility] H inpatient unit from 07/18/2015 through 07/21/2015.                                           She has seen Dr. Toni Arthurs on an outpatient basis and has also seen a therapist keep Margaret Marshall.   Past Medical History: As listed Past Medical History:  Diagnosis Date  . Anxiety   . Depression   . Mood disorder [facility])     Past Surgical History:  Procedure Laterality Date  . NO PAST SURGERIES         reviewed information below with patient on 08/24/16  and same as previous visits except as noted Family History: Dad has ADHD and bipolar disorder., 3 siblings have depression and anxiety, maternal grandfather has alcohol problems, maternal uncle has drug problems. Family History  Problem Relation Age of Onset  . Depression Mother   . Depression Sister   . Depression Brother   . Alcohol abuse Maternal Uncle   . Bipolar disorder Father   . ADD / ADHD Father    Social History:  Social History   Social History  . Marital status: Single    Spouse name: N/A  . Number of children: N/A  . Years of education: N/A   Social History Main Topics  . Smoking status: Never Smoker  . Smokeless tobacco: Never Used  . Alcohol use No     Comment: Rare  . Drug use: No  . Sexual activity: Not Currently    Birth control/ protection: None   Other Topics Concern  . None   Social History Narrative   Living in [place] with mom. Born and raised in [place] by parents. Pt has 4 older siblings. Pt has graduated HS and did one semester of online college. Pt is working part time in retail. Single, no kids.   Denies any legal problems in the past     Musculoskeletal: Strength & Muscle Tone: within normal limits Gait & Station: normal Patient leans: stands straight  Psychiatric Specialty Exam: reviewed ROS and MSE on 08/24/16  and same as previous visits except as noted  HPI  Review of Systems   Constitutional: Negative for chills, diaphoresis, fever and malaise/fatigue.  HENT: Negative for congestion, ear pain, hearing loss, nosebleeds, sinus pain and sore throat.   Neurological: Negative for dizziness, tingling, tremors, sensory change, seizures, loss of consciousness and headaches.  Psychiatric/Behavioral: Negative for hallucinations and substance abuse. The patient is nervous/anxious.   All other systems reviewed and are negative.  Blood pressure 118/82, pulse 78, height 5\' 7"  (1.702 m), weight 135 lb (61.2 kg), SpO2 98 %.Body mass index is 21.14 kg/m.  General Appearance: Casual  Eye Contact:  Good  Speech:  Clear and Coherent and Normal Rate  Volume:  Normal  Mood:   Mildly anxious  Affect:   appropriate  Thought Process:  Goal Directed, Linear and Logical  Orientation:  Full (Time, Place, and Person)  Thought Content:   WDL  Suicidal Thoughts:  No  Homicidal Thoughts:  No  Memory:  Immediate;   Good Recent;   Good Remote;   Good  Judgement:  Intact  Insight:  Present  Psychomotor Activity:  Normal  Concentration:  Fair  Recall:  Good  Fund of Knowledge:Good  Language: Good  Akathisia:  No  Handed:  Right  AIMS (if indicated):  0  Assets:  Communication Skills Desire for Improvement Financial Resources/Insurance Housing Physical Health Resilience Social Support  ADL's:  Intact  Cognition: WNL  Sleep:  Fair    Is the patient at risk to self?  No. Has the patient been a risk to self in the past 6 months?  Yes.   Has the patient been a risk to self within the distant past?  No. Is the patient a risk to others?  No. Has the patient been a risk to others in the past 6 months?  No. Has the patient been a risk to others within the distant past?  No.  Allergies:  No Known Allergies Current Medications: Current Outpatient Prescriptions  Medication Sig Dispense Refill  . ARIPiprazole (ABILIFY) 5 MG tablet Take 1 tablet (5 mg total) by mouth daily. 30  tablet 2  . buPROPion (WELLBUTRIN XL) 300 MG 24 hr tablet Take 1 tablet (300 mg total) by mouth daily. 90 tablet 0  . cloNIDine (CATAPRES) 0.1 MG tablet Take 1 tablet (0.1 mg total) by mouth 2 (two) times daily as needed (anxiety). 60 tablet 1  . testosterone cypionate (DEPOTESTOSTERONE CYPIONATE) 200 MG/ML injection Inject 120 mg into the muscle.     No current facility-administered medications for this visit.      Reviewed A&P below with patient on 08/24/16  and same as previous visits except as noted  Treatment Plan Summary: Medication management #1 Maj. depressive disorder severe without psychotic features.  continue Wellbutrin XL 300 mg by mouth every morning. Abilify 5 mg by mouth every at bedtime   #2 generalized anxiety disorder vs Social anxiety disorder. Relaxation techniques and deep breathing was discussed in detail with the patient. D/c Clonidine. Failed Buspar, Vistaril -start trial of Ativan 0.5mg  po qD prn anxiety- given 45 tabs for 90 days  #3 gender dysphoria Patient will continue with her therapist regarding this.  #4 labs 08/13/2015  CBC, CMP, TSH T4 and prolactin level was normal and  her lipid panel was elevated discussed a healthy diet patient stated understanding. EKG 07/19/2015 EKG NRS, QTc 421  #5 therapy [facility]'ll [facility] with [facility].  Pt denies SI and is at an acute low risk for suicide.Patient told to call clinic if any problems occur. Patient advised to go to ER if they should develop SI/HI, side effects, or if symptoms worsen. Has crisis numbers to call if needed. Pt verbalized understanding.  F/up in 2 months or sooner if needed   [facility] Drucella Karbowski 3/1/20188:59 AM

## 2016-09-06 DIAGNOSIS — F341 Dysthymic disorder: Secondary | ICD-10-CM | POA: Diagnosis not present

## 2016-09-06 DIAGNOSIS — R69 Illness, unspecified: Secondary | ICD-10-CM | POA: Diagnosis not present

## 2016-09-06 DIAGNOSIS — F640 Transsexualism: Secondary | ICD-10-CM | POA: Diagnosis not present

## 2016-09-13 DIAGNOSIS — R69 Illness, unspecified: Secondary | ICD-10-CM | POA: Diagnosis not present

## 2016-09-13 DIAGNOSIS — F3341 Major depressive disorder, recurrent, in partial remission: Secondary | ICD-10-CM | POA: Diagnosis not present

## 2016-09-20 DIAGNOSIS — F341 Dysthymic disorder: Secondary | ICD-10-CM | POA: Diagnosis not present

## 2016-09-20 DIAGNOSIS — R69 Illness, unspecified: Secondary | ICD-10-CM | POA: Diagnosis not present

## 2016-09-20 DIAGNOSIS — F640 Transsexualism: Secondary | ICD-10-CM | POA: Diagnosis not present

## 2016-10-04 DIAGNOSIS — F640 Transsexualism: Secondary | ICD-10-CM | POA: Diagnosis not present

## 2016-10-04 DIAGNOSIS — R69 Illness, unspecified: Secondary | ICD-10-CM | POA: Diagnosis not present

## 2016-10-04 DIAGNOSIS — F341 Dysthymic disorder: Secondary | ICD-10-CM | POA: Diagnosis not present

## 2016-10-19 DIAGNOSIS — R69 Illness, unspecified: Secondary | ICD-10-CM | POA: Diagnosis not present

## 2016-10-19 DIAGNOSIS — F341 Dysthymic disorder: Secondary | ICD-10-CM | POA: Diagnosis not present

## 2016-10-19 DIAGNOSIS — F640 Transsexualism: Secondary | ICD-10-CM | POA: Diagnosis not present

## 2016-10-26 ENCOUNTER — Ambulatory Visit (HOSPITAL_COMMUNITY): Payer: Self-pay | Admitting: Psychiatry

## 2016-11-01 DIAGNOSIS — R69 Illness, unspecified: Secondary | ICD-10-CM | POA: Diagnosis not present

## 2016-11-01 DIAGNOSIS — F640 Transsexualism: Secondary | ICD-10-CM | POA: Diagnosis not present

## 2016-11-01 DIAGNOSIS — F341 Dysthymic disorder: Secondary | ICD-10-CM | POA: Diagnosis not present

## 2016-11-08 DIAGNOSIS — R69 Illness, unspecified: Secondary | ICD-10-CM | POA: Diagnosis not present

## 2016-11-08 DIAGNOSIS — F3341 Major depressive disorder, recurrent, in partial remission: Secondary | ICD-10-CM | POA: Diagnosis not present

## 2016-11-13 DIAGNOSIS — Z79899 Other long term (current) drug therapy: Secondary | ICD-10-CM | POA: Diagnosis not present

## 2016-11-13 DIAGNOSIS — R69 Illness, unspecified: Secondary | ICD-10-CM | POA: Diagnosis not present

## 2016-11-15 DIAGNOSIS — F341 Dysthymic disorder: Secondary | ICD-10-CM | POA: Diagnosis not present

## 2016-11-15 DIAGNOSIS — F640 Transsexualism: Secondary | ICD-10-CM | POA: Diagnosis not present

## 2016-11-15 DIAGNOSIS — R69 Illness, unspecified: Secondary | ICD-10-CM | POA: Diagnosis not present

## 2016-12-13 DIAGNOSIS — F3341 Major depressive disorder, recurrent, in partial remission: Secondary | ICD-10-CM | POA: Diagnosis not present

## 2016-12-13 DIAGNOSIS — R69 Illness, unspecified: Secondary | ICD-10-CM | POA: Diagnosis not present

## 2016-12-14 DIAGNOSIS — R69 Illness, unspecified: Secondary | ICD-10-CM | POA: Diagnosis not present

## 2016-12-14 DIAGNOSIS — F341 Dysthymic disorder: Secondary | ICD-10-CM | POA: Diagnosis not present

## 2016-12-14 DIAGNOSIS — F640 Transsexualism: Secondary | ICD-10-CM | POA: Diagnosis not present

## 2017-01-10 DIAGNOSIS — F341 Dysthymic disorder: Secondary | ICD-10-CM | POA: Diagnosis not present

## 2017-01-10 DIAGNOSIS — R69 Illness, unspecified: Secondary | ICD-10-CM | POA: Diagnosis not present

## 2017-01-10 DIAGNOSIS — F640 Transsexualism: Secondary | ICD-10-CM | POA: Diagnosis not present

## 2017-01-18 ENCOUNTER — Other Ambulatory Visit (HOSPITAL_COMMUNITY): Payer: Self-pay | Admitting: Psychiatry

## 2017-01-18 DIAGNOSIS — F332 Major depressive disorder, recurrent severe without psychotic features: Secondary | ICD-10-CM

## 2017-01-24 DIAGNOSIS — E281 Androgen excess: Secondary | ICD-10-CM | POA: Diagnosis not present

## 2017-03-02 DIAGNOSIS — F640 Transsexualism: Secondary | ICD-10-CM | POA: Diagnosis not present

## 2017-03-02 DIAGNOSIS — F341 Dysthymic disorder: Secondary | ICD-10-CM | POA: Diagnosis not present

## 2017-03-02 DIAGNOSIS — R69 Illness, unspecified: Secondary | ICD-10-CM | POA: Diagnosis not present

## 2017-03-12 DIAGNOSIS — H5210 Myopia, unspecified eye: Secondary | ICD-10-CM | POA: Diagnosis not present

## 2017-03-14 DIAGNOSIS — R69 Illness, unspecified: Secondary | ICD-10-CM | POA: Diagnosis not present

## 2017-03-14 DIAGNOSIS — F3341 Major depressive disorder, recurrent, in partial remission: Secondary | ICD-10-CM | POA: Diagnosis not present

## 2017-03-26 DIAGNOSIS — F640 Transsexualism: Secondary | ICD-10-CM | POA: Diagnosis not present

## 2017-03-26 DIAGNOSIS — F341 Dysthymic disorder: Secondary | ICD-10-CM | POA: Diagnosis not present

## 2017-03-26 DIAGNOSIS — R69 Illness, unspecified: Secondary | ICD-10-CM | POA: Diagnosis not present

## 2017-04-11 DIAGNOSIS — R69 Illness, unspecified: Secondary | ICD-10-CM | POA: Diagnosis not present

## 2017-04-11 DIAGNOSIS — F3341 Major depressive disorder, recurrent, in partial remission: Secondary | ICD-10-CM | POA: Diagnosis not present

## 2017-05-08 DIAGNOSIS — F3341 Major depressive disorder, recurrent, in partial remission: Secondary | ICD-10-CM | POA: Diagnosis not present

## 2017-05-08 DIAGNOSIS — R69 Illness, unspecified: Secondary | ICD-10-CM | POA: Diagnosis not present

## 2017-05-23 DIAGNOSIS — Z23 Encounter for immunization: Secondary | ICD-10-CM | POA: Diagnosis not present

## 2017-05-23 DIAGNOSIS — H6123 Impacted cerumen, bilateral: Secondary | ICD-10-CM | POA: Diagnosis not present

## 2017-05-23 DIAGNOSIS — F411 Generalized anxiety disorder: Secondary | ICD-10-CM | POA: Diagnosis not present

## 2017-05-23 DIAGNOSIS — R69 Illness, unspecified: Secondary | ICD-10-CM | POA: Diagnosis not present

## 2017-05-30 DIAGNOSIS — F3341 Major depressive disorder, recurrent, in partial remission: Secondary | ICD-10-CM | POA: Diagnosis not present

## 2017-05-30 DIAGNOSIS — R69 Illness, unspecified: Secondary | ICD-10-CM | POA: Diagnosis not present

## 2017-06-07 DIAGNOSIS — R69 Illness, unspecified: Secondary | ICD-10-CM | POA: Diagnosis not present

## 2017-06-07 DIAGNOSIS — F3341 Major depressive disorder, recurrent, in partial remission: Secondary | ICD-10-CM | POA: Diagnosis not present

## 2017-06-14 DIAGNOSIS — R69 Illness, unspecified: Secondary | ICD-10-CM | POA: Diagnosis not present

## 2017-06-14 DIAGNOSIS — F3341 Major depressive disorder, recurrent, in partial remission: Secondary | ICD-10-CM | POA: Diagnosis not present

## 2017-07-10 DIAGNOSIS — R69 Illness, unspecified: Secondary | ICD-10-CM | POA: Diagnosis not present

## 2017-07-10 DIAGNOSIS — F3341 Major depressive disorder, recurrent, in partial remission: Secondary | ICD-10-CM | POA: Diagnosis not present

## 2017-07-26 DIAGNOSIS — R69 Illness, unspecified: Secondary | ICD-10-CM | POA: Diagnosis not present

## 2017-07-26 DIAGNOSIS — F3341 Major depressive disorder, recurrent, in partial remission: Secondary | ICD-10-CM | POA: Diagnosis not present

## 2017-08-01 DIAGNOSIS — K011 Impacted teeth: Secondary | ICD-10-CM | POA: Diagnosis not present

## 2017-08-06 DIAGNOSIS — F3341 Major depressive disorder, recurrent, in partial remission: Secondary | ICD-10-CM | POA: Diagnosis not present

## 2017-08-06 DIAGNOSIS — R69 Illness, unspecified: Secondary | ICD-10-CM | POA: Diagnosis not present

## 2017-08-08 DIAGNOSIS — R69 Illness, unspecified: Secondary | ICD-10-CM | POA: Diagnosis not present

## 2017-08-08 DIAGNOSIS — F3341 Major depressive disorder, recurrent, in partial remission: Secondary | ICD-10-CM | POA: Diagnosis not present

## 2017-08-22 DIAGNOSIS — R69 Illness, unspecified: Secondary | ICD-10-CM | POA: Diagnosis not present

## 2017-08-22 DIAGNOSIS — F3341 Major depressive disorder, recurrent, in partial remission: Secondary | ICD-10-CM | POA: Diagnosis not present

## 2017-09-05 DIAGNOSIS — R69 Illness, unspecified: Secondary | ICD-10-CM | POA: Diagnosis not present

## 2017-09-05 DIAGNOSIS — F3341 Major depressive disorder, recurrent, in partial remission: Secondary | ICD-10-CM | POA: Diagnosis not present

## 2017-09-11 DIAGNOSIS — Z79899 Other long term (current) drug therapy: Secondary | ICD-10-CM | POA: Diagnosis not present

## 2017-09-11 DIAGNOSIS — R69 Illness, unspecified: Secondary | ICD-10-CM | POA: Diagnosis not present

## 2017-10-02 DIAGNOSIS — R69 Illness, unspecified: Secondary | ICD-10-CM | POA: Diagnosis not present

## 2017-10-02 DIAGNOSIS — F3341 Major depressive disorder, recurrent, in partial remission: Secondary | ICD-10-CM | POA: Diagnosis not present

## 2017-10-17 DIAGNOSIS — F3341 Major depressive disorder, recurrent, in partial remission: Secondary | ICD-10-CM | POA: Diagnosis not present

## 2017-10-17 DIAGNOSIS — R69 Illness, unspecified: Secondary | ICD-10-CM | POA: Diagnosis not present

## 2017-10-25 DIAGNOSIS — F3341 Major depressive disorder, recurrent, in partial remission: Secondary | ICD-10-CM | POA: Diagnosis not present

## 2017-10-25 DIAGNOSIS — R69 Illness, unspecified: Secondary | ICD-10-CM | POA: Diagnosis not present

## 2017-10-30 DIAGNOSIS — R69 Illness, unspecified: Secondary | ICD-10-CM | POA: Diagnosis not present

## 2017-10-30 DIAGNOSIS — F3341 Major depressive disorder, recurrent, in partial remission: Secondary | ICD-10-CM | POA: Diagnosis not present

## 2017-11-13 DIAGNOSIS — F3341 Major depressive disorder, recurrent, in partial remission: Secondary | ICD-10-CM | POA: Diagnosis not present

## 2017-11-13 DIAGNOSIS — R69 Illness, unspecified: Secondary | ICD-10-CM | POA: Diagnosis not present

## 2017-11-27 DIAGNOSIS — F3341 Major depressive disorder, recurrent, in partial remission: Secondary | ICD-10-CM | POA: Diagnosis not present

## 2017-11-27 DIAGNOSIS — R69 Illness, unspecified: Secondary | ICD-10-CM | POA: Diagnosis not present

## 2017-12-03 DIAGNOSIS — R69 Illness, unspecified: Secondary | ICD-10-CM | POA: Diagnosis not present

## 2017-12-03 DIAGNOSIS — F3341 Major depressive disorder, recurrent, in partial remission: Secondary | ICD-10-CM | POA: Diagnosis not present

## 2017-12-13 DIAGNOSIS — F3341 Major depressive disorder, recurrent, in partial remission: Secondary | ICD-10-CM | POA: Diagnosis not present

## 2017-12-13 DIAGNOSIS — R69 Illness, unspecified: Secondary | ICD-10-CM | POA: Diagnosis not present

## 2018-01-03 DIAGNOSIS — F3341 Major depressive disorder, recurrent, in partial remission: Secondary | ICD-10-CM | POA: Diagnosis not present

## 2018-01-03 DIAGNOSIS — R69 Illness, unspecified: Secondary | ICD-10-CM | POA: Diagnosis not present

## 2018-01-03 DIAGNOSIS — F3130 Bipolar disorder, current episode depressed, mild or moderate severity, unspecified: Secondary | ICD-10-CM | POA: Diagnosis not present

## 2018-01-17 DIAGNOSIS — F3341 Major depressive disorder, recurrent, in partial remission: Secondary | ICD-10-CM | POA: Diagnosis not present

## 2018-01-17 DIAGNOSIS — R69 Illness, unspecified: Secondary | ICD-10-CM | POA: Diagnosis not present

## 2018-01-17 DIAGNOSIS — F3130 Bipolar disorder, current episode depressed, mild or moderate severity, unspecified: Secondary | ICD-10-CM | POA: Diagnosis not present

## 2018-01-31 DIAGNOSIS — F3341 Major depressive disorder, recurrent, in partial remission: Secondary | ICD-10-CM | POA: Diagnosis not present

## 2018-01-31 DIAGNOSIS — R69 Illness, unspecified: Secondary | ICD-10-CM | POA: Diagnosis not present

## 2018-01-31 DIAGNOSIS — F3130 Bipolar disorder, current episode depressed, mild or moderate severity, unspecified: Secondary | ICD-10-CM | POA: Diagnosis not present

## 2018-02-04 DIAGNOSIS — F3341 Major depressive disorder, recurrent, in partial remission: Secondary | ICD-10-CM | POA: Diagnosis not present

## 2018-02-04 DIAGNOSIS — R69 Illness, unspecified: Secondary | ICD-10-CM | POA: Diagnosis not present

## 2018-02-04 DIAGNOSIS — F3130 Bipolar disorder, current episode depressed, mild or moderate severity, unspecified: Secondary | ICD-10-CM | POA: Diagnosis not present

## 2018-02-19 DIAGNOSIS — R69 Illness, unspecified: Secondary | ICD-10-CM | POA: Diagnosis not present

## 2018-02-19 DIAGNOSIS — F3130 Bipolar disorder, current episode depressed, mild or moderate severity, unspecified: Secondary | ICD-10-CM | POA: Diagnosis not present

## 2018-02-19 DIAGNOSIS — F3341 Major depressive disorder, recurrent, in partial remission: Secondary | ICD-10-CM | POA: Diagnosis not present

## 2018-02-26 ENCOUNTER — Ambulatory Visit (INDEPENDENT_AMBULATORY_CARE_PROVIDER_SITE_OTHER): Payer: Managed Care, Other (non HMO) | Admitting: Family Medicine

## 2018-02-26 ENCOUNTER — Encounter: Payer: Self-pay | Admitting: Family Medicine

## 2018-02-26 VITALS — BP 120/70 | HR 78 | Ht 67.75 in | Wt 125.4 lb

## 2018-02-26 DIAGNOSIS — F411 Generalized anxiety disorder: Secondary | ICD-10-CM

## 2018-02-26 DIAGNOSIS — F332 Major depressive disorder, recurrent severe without psychotic features: Secondary | ICD-10-CM | POA: Diagnosis not present

## 2018-02-26 DIAGNOSIS — F640 Transsexualism: Secondary | ICD-10-CM | POA: Diagnosis not present

## 2018-02-26 DIAGNOSIS — R69 Illness, unspecified: Secondary | ICD-10-CM | POA: Diagnosis not present

## 2018-02-26 DIAGNOSIS — Z79899 Other long term (current) drug therapy: Secondary | ICD-10-CM

## 2018-02-26 NOTE — Patient Instructions (Signed)
It was very nice to meet you today! We'll be in touch with lab results If interested in having gender confirmation surgery- Dr. Georgeanne Nim in [place] or Dr. Burna Sis in [facility] may be helpful. I would recommend that you have PAP smears, you can choose to have those completed here or they may be done at a GYN office.

## 2018-02-26 NOTE — Assessment & Plan Note (Signed)
Fairly stable at this time.  Will continue to be managed by Psychiatry at [facility].

## 2018-02-26 NOTE — Assessment & Plan Note (Signed)
Anxiety is generally not well controlled with propranolol.  Will discuss with psychiatrist at next appt.

## 2018-02-26 NOTE — Assessment & Plan Note (Signed)
Receiving hormone therapy with testosterone.  Continue counseling for ongoing depression/anxiety Will place referral to endocrinology Last testosterone injection was yesterday, will check peak levels.  Check estradiol and CBC.  Interested in gender confirmation surgery (top surgery), given information on local plastic surgeries.  Discussed that he would still need screenings for cervical cancer.  He will decide whether he would like to have this completed with me or see GYN.

## 2018-02-26 NOTE — Progress Notes (Signed)
Margaret Marshall - 22 y.o. adult MRN [id number]  Date of birth: 1996-01-18  Subjective Chief Complaint  Patient presents with  . Depression    HPI Margaret Marshall is 22 y.o. transgender female with history of gender dysphoria, depression and anxiety.  He is here today to establish care and for medication management.  He started testosterone supplementation about 1.5 years ago and was managed by Dr. Maudie Mercury (endocrine) initially.  After his retirement [facility], but would prefer to see endocrinology again.  He is unsure the last time he had labs checked for testosterone levels.  He did have a CPE in 10/2017 however a pap was not done at that time.  He is interested in having gender confirmation surgery, especially top surgery in the future.    In regards to depression and anxiety, this is an ongoing issue.  He is followed by psychiatry and is seeing a therapist.  Current medications include wellbutrin and propanolol.  Does not feel that propranolol is effective for anxiety.  Has taken ativan previously but was weaned from this medication.  He has been hospitalized in the past for suicidal ideations but denies any at this time.  Originally from [place] and close friends are still located there.   Depression screen [facility] 2/9 02/26/2018 10/04/2015  Decreased Interest 1 1  Down, Depressed, Hopeless 2 0  PHQ - 2 Score 3 1  Altered sleeping 0 -  Tired, decreased energy 1 -  Change in appetite 1 -  Feeling bad or failure about yourself  0 -  Trouble concentrating 1 -  Moving slowly or fidgety/restless 0 -  Suicidal thoughts 0 -  PHQ-9 Score 6 -   GAD 7 : Generalized Anxiety Score 02/26/2018  Nervous, Anxious, on Edge 2  Control/stop worrying 1  Worry too much - different things 2  Trouble relaxing 1  Restless 0  Easily annoyed or irritable 1  Afraid - awful might happen 0  Total GAD 7 Score 7   ROS:  A comprehensive ROS was completed and negative  except as noted per HPI    No Known Allergies  Past Medical History:  Diagnosis Date  . Anxiety   . Depression   . Mood disorder [facility])     Past Surgical History:  Procedure Laterality Date  . NO PAST SURGERIES      Social History   Socioeconomic History  . Marital status: Single    Spouse name: Not on file  . Number of children: Not on file  . Years of education: Not on file  . Highest education level: Not on file  Occupational History  . Not on file  Social Needs  . Financial resource strain: Not on file  . Food insecurity:    Worry: Not on file    Inability: Not on file  . Transportation needs:    Medical: Not on file    Non-medical: Not on file  Tobacco Use  . Smoking status: Never Smoker  . Smokeless tobacco: Never Used  Substance and Sexual Activity  . Alcohol use: Yes    Comment: occass.   . Drug use: No  . Sexual activity: Not Currently    Birth control/protection: None  Lifestyle  . Physical activity:    Days per week: Not on file    Minutes per session: Not on file  . Stress: Not on file  Relationships  . Social connections:    Talks on phone: Not on  file    Gets together: Not on file    Attends religious service: Not on file    Active member of club or organization: Not on file    Attends meetings of clubs or organizations: Not on file    Relationship status: Not on file  Other Topics Concern  . Not on file  Social History Narrative   Living in [place] with mom. Born and raised in [place] by parents. Pt has 4 older siblings. Pt has graduated HS and did one semester of online college. Pt is working part time in retail. Single, no kids.   Denies any legal problems in the past    Family History  Problem Relation Age of Onset  . Depression Mother   . Depression Sister   . Depression Brother   . Alcohol abuse Maternal Uncle   . Bipolar disorder Father   . ADD / ADHD Father     Health Maintenance  Topic Date Due  . TETANUS/TDAP   04/28/2015  . PAP SMEAR  04/27/2017  . INFLUENZA VACCINE  01/24/2018  . HIV Screening  02/27/2019 (Originally 04/28/2011)    ----------------------------------------------------------------------------------------------------------------------------------------------------------------------------------------------------------------- Physical Exam BP 120/70 (BP Location: Left Arm, Patient Position: Sitting, Cuff Size: Normal)   Pulse 78   Ht 5' 7.75" (1.721 m)   Wt 125 lb 6.4 oz (56.9 kg)   BMI 19.21 kg/m   Physical Exam  Constitutional: He is oriented to person, place, and time. He appears well-nourished. No distress.  HENT:  Head: Normocephalic and atraumatic.  Mouth/Throat: Oropharynx is clear and moist.  Eyes: No scleral icterus.  Neck: Neck supple. No thyromegaly present.  Cardiovascular: Normal rate, regular rhythm and normal heart sounds.  Pulmonary/Chest: Effort normal and breath sounds normal.  Musculoskeletal: He exhibits no edema.  Lymphadenopathy:    He has no cervical adenopathy.  Neurological: He is alert and oriented to person, place, and time.  Skin: Skin is warm and dry. No rash noted.  Facial hair present  Psychiatric: He has a normal mood and affect. His behavior is normal.    ------------------------------------------------------------------------------------------------------------------------------------------------------------------------------------------------------------------- Assessment and Plan  Gender dysphoria in adult Receiving hormone therapy with testosterone.  Continue counseling for ongoing depression/anxiety Will place referral to endocrinology Last testosterone injection was yesterday, will check peak levels.  Check estradiol and CBC.  Interested in gender confirmation surgery (top surgery), given information on local plastic surgeries.  Discussed that he would still need screenings for cervical cancer.  He will decide whether he would  like to have this completed with me or see GYN.    MDD (major depressive disorder), recurrent severe, without psychosis (HCC) Fairly stable at this time.  Will continue to be managed by Psychiatry at [facility].    GAD (generalized anxiety disorder) Anxiety is generally not well controlled with propranolol.  Will discuss with psychiatrist at next appt.

## 2018-02-27 LAB — CBC
HCT: 48 % — ABNORMAL HIGH (ref 35.0–45.0)
HEMOGLOBIN: 16 g/dL — AB (ref 11.7–15.5)
MCH: 29.4 pg (ref 27.0–33.0)
MCHC: 33.3 g/dL (ref 32.0–36.0)
MCV: 88.2 fL (ref 80.0–100.0)
MPV: 11.1 fL (ref 7.5–12.5)
Platelets: 260 10*3/uL (ref 140–400)
RBC: 5.44 10*6/uL — AB (ref 3.80–5.10)
RDW: 13 % (ref 11.0–15.0)
WBC: 4.6 10*3/uL (ref 3.8–10.8)

## 2018-02-27 LAB — TESTOSTERONE: TESTOSTERONE: 1206 ng/dL

## 2018-02-27 LAB — ESTRADIOL: ESTRADIOL: 33 pg/mL

## 2018-02-27 LAB — TESTOSTERONE, TOTAL, LC/MS/MS

## 2018-02-28 DIAGNOSIS — F3341 Major depressive disorder, recurrent, in partial remission: Secondary | ICD-10-CM | POA: Diagnosis not present

## 2018-02-28 DIAGNOSIS — R69 Illness, unspecified: Secondary | ICD-10-CM | POA: Diagnosis not present

## 2018-02-28 DIAGNOSIS — F3130 Bipolar disorder, current episode depressed, mild or moderate severity, unspecified: Secondary | ICD-10-CM | POA: Diagnosis not present

## 2018-03-01 NOTE — Progress Notes (Signed)
Please let him know that labs look ok

## 2018-03-13 ENCOUNTER — Ambulatory Visit (INDEPENDENT_AMBULATORY_CARE_PROVIDER_SITE_OTHER): Payer: Managed Care, Other (non HMO)

## 2018-03-13 DIAGNOSIS — Z23 Encounter for immunization: Secondary | ICD-10-CM | POA: Diagnosis not present

## 2018-03-13 DIAGNOSIS — Z111 Encounter for screening for respiratory tuberculosis: Secondary | ICD-10-CM | POA: Diagnosis not present

## 2018-03-13 NOTE — Progress Notes (Addendum)
PPD test was administered per verbal order from Dr Ashley RoyaltyMatthews. Intramdermal injection given R forearm. Pt tolerated well. Instructed to return in 48-72 to read results.  Influenza injection was given per Dr Ashley RoyaltyMatthews verbal order. IM injection given in L deltoid. Pt given vaccine information. No S/S were observed prior to leaving office.  Tdap injection was given per Dr Ashley RoyaltyMatthews verbal order. IM injection was given R deltoid. Pt tolerated well. Pt given vaccine information. No S/S observed prior to leaving office.

## 2018-03-15 ENCOUNTER — Ambulatory Visit: Payer: Managed Care, Other (non HMO)

## 2018-03-15 DIAGNOSIS — Z111 Encounter for screening for respiratory tuberculosis: Secondary | ICD-10-CM

## 2018-03-15 LAB — TB SKIN TEST
Induration: 0 mm
TB Skin Test: NEGATIVE

## 2018-03-15 NOTE — Progress Notes (Signed)
PPD Reading Note  PPD read and results entered in EpicCare.  Result: 0 mm induration.  Interpretation: negative  If test not read within 48-72 hours of initial placement, patient advised to repeat in other arm 1-3 weeks after this test.  Allergic reaction: no

## 2018-03-27 DIAGNOSIS — F3341 Major depressive disorder, recurrent, in partial remission: Secondary | ICD-10-CM | POA: Diagnosis not present

## 2018-03-27 DIAGNOSIS — F3130 Bipolar disorder, current episode depressed, mild or moderate severity, unspecified: Secondary | ICD-10-CM | POA: Diagnosis not present

## 2018-03-27 DIAGNOSIS — R69 Illness, unspecified: Secondary | ICD-10-CM | POA: Diagnosis not present

## 2018-04-03 DIAGNOSIS — N3000 Acute cystitis without hematuria: Secondary | ICD-10-CM | POA: Diagnosis not present

## 2018-04-09 DIAGNOSIS — R69 Illness, unspecified: Secondary | ICD-10-CM | POA: Diagnosis not present

## 2018-04-09 DIAGNOSIS — F3130 Bipolar disorder, current episode depressed, mild or moderate severity, unspecified: Secondary | ICD-10-CM | POA: Diagnosis not present

## 2018-04-09 DIAGNOSIS — F3341 Major depressive disorder, recurrent, in partial remission: Secondary | ICD-10-CM | POA: Diagnosis not present

## 2018-04-22 DIAGNOSIS — F3341 Major depressive disorder, recurrent, in partial remission: Secondary | ICD-10-CM | POA: Diagnosis not present

## 2018-04-22 DIAGNOSIS — R69 Illness, unspecified: Secondary | ICD-10-CM | POA: Diagnosis not present

## 2018-04-22 DIAGNOSIS — F3130 Bipolar disorder, current episode depressed, mild or moderate severity, unspecified: Secondary | ICD-10-CM | POA: Diagnosis not present

## 2018-04-24 ENCOUNTER — Ambulatory Visit: Payer: Managed Care, Other (non HMO) | Admitting: Endocrinology

## 2018-04-29 DIAGNOSIS — R69 Illness, unspecified: Secondary | ICD-10-CM | POA: Diagnosis not present

## 2018-04-29 DIAGNOSIS — F3130 Bipolar disorder, current episode depressed, mild or moderate severity, unspecified: Secondary | ICD-10-CM | POA: Diagnosis not present

## 2018-04-29 DIAGNOSIS — F3341 Major depressive disorder, recurrent, in partial remission: Secondary | ICD-10-CM | POA: Diagnosis not present

## 2018-05-13 DIAGNOSIS — F3130 Bipolar disorder, current episode depressed, mild or moderate severity, unspecified: Secondary | ICD-10-CM | POA: Diagnosis not present

## 2018-05-13 DIAGNOSIS — R69 Illness, unspecified: Secondary | ICD-10-CM | POA: Diagnosis not present

## 2018-05-13 DIAGNOSIS — F3341 Major depressive disorder, recurrent, in partial remission: Secondary | ICD-10-CM | POA: Diagnosis not present

## 2018-05-14 ENCOUNTER — Ambulatory Visit (INDEPENDENT_AMBULATORY_CARE_PROVIDER_SITE_OTHER): Payer: 59 | Admitting: Endocrinology

## 2018-05-14 ENCOUNTER — Encounter: Payer: Self-pay | Admitting: Endocrinology

## 2018-05-14 ENCOUNTER — Telehealth: Payer: Self-pay | Admitting: Endocrinology

## 2018-05-14 DIAGNOSIS — F640 Transsexualism: Secondary | ICD-10-CM | POA: Diagnosis not present

## 2018-05-14 DIAGNOSIS — Z789 Other specified health status: Secondary | ICD-10-CM

## 2018-05-14 DIAGNOSIS — R69 Illness, unspecified: Secondary | ICD-10-CM | POA: Diagnosis not present

## 2018-05-14 LAB — CBC WITH DIFFERENTIAL/PLATELET
Basophils Absolute: 0 10*3/uL (ref 0.0–0.1)
Basophils Relative: 0.9 % (ref 0.0–3.0)
Eosinophils Absolute: 0.1 10*3/uL (ref 0.0–0.7)
Eosinophils Relative: 2.9 % (ref 0.0–5.0)
HEMATOCRIT: 50.2 % — AB (ref 36.0–46.0)
Hemoglobin: 16.9 g/dL — ABNORMAL HIGH (ref 12.0–15.0)
Lymphocytes Relative: 30.6 % (ref 12.0–46.0)
Lymphs Abs: 1.3 10*3/uL (ref 0.7–4.0)
MCHC: 33.7 g/dL (ref 30.0–36.0)
MCV: 88.4 fl (ref 78.0–100.0)
MONOS PCT: 8.7 % (ref 3.0–12.0)
Monocytes Absolute: 0.4 10*3/uL (ref 0.1–1.0)
Neutro Abs: 2.3 10*3/uL (ref 1.4–7.7)
Neutrophils Relative %: 56.9 % (ref 43.0–77.0)
Platelets: 229 10*3/uL (ref 150.0–400.0)
RBC: 5.68 Mil/uL — AB (ref 3.87–5.11)
RDW: 13.9 % (ref 11.5–15.5)
WBC: 4.1 10*3/uL (ref 4.0–10.5)

## 2018-05-14 LAB — TSH: TSH: 0.94 u[IU]/mL (ref 0.35–4.50)

## 2018-05-14 MED ORDER — TESTOSTERONE CYPIONATE 200 MG/ML IM SOLN: 60.0000 mg | INTRAMUSCULAR | 0 refills | Status: DC

## 2018-05-14 NOTE — Telephone Encounter (Signed)
please call patient: Please make the me adjustment we discussed.  Please redo the blood tests in 4-6 weeks.

## 2018-05-14 NOTE — Patient Instructions (Addendum)
Please change the testosterone to 60 mg every week.   Please redo the blood tests in 4-6 weeks.   Testosterone treatment has risks, including increased or decreased fertility (depending on the type of treatment), hair loss, prostate cancer, benign prostate enlargement, blood clots, liver problems, lower hdl ("good cholesterol"), polycythemia (opposite of anemia), sleep apnea, and behavior changes.   Please come back for a follow-up appointment in 1 year.

## 2018-05-14 NOTE — Progress Notes (Signed)
Subjective:    Patient ID: Margaret Marshall, adult    DOB: 01-Oct-1995, 22 y.o.   MRN: [id number]  HPI Pt is referred by Dr Ashley Royalty, for transgender state (F to M).  Pt reports he first suspected the transgender state at age 44.  He had puberty at the age of 6.  He has never had transgender surgery.  He sees counselor at the [facility].  He has been on testosterone treatmtent x 2 years.  He has only taken injections.  He has no children.  He has no h/o DVT, dyslipidemia, liver disease, kidney disease, migraine, sleep apnea, heart disease, CVA, diabetes, smoking, HTN, epilepsy, violent behavior, gallbladder disease, blood disorders, osteoporosis, or cancer.  He has mild acne on the face, and assoc terminal hair growth.  He takes 140 mg every 2 weeks.  He has been on this dosage x 5 months.  Past Medical History:  Diagnosis Date  . Anxiety   . Depression   . Mood disorder [facility])     Past Surgical History:  Procedure Laterality Date  . NO PAST SURGERIES      Social History   Socioeconomic History  . Marital status: Single    Spouse name: Not on file  . Number of children: Not on file  . Years of education: Not on file  . Highest education level: Not on file  Occupational History  . Not on file  Social Needs  . Financial resource strain: Not on file  . Food insecurity:    Worry: Not on file    Inability: Not on file  . Transportation needs:    Medical: Not on file    Non-medical: Not on file  Tobacco Use  . Smoking status: Never Smoker  . Smokeless tobacco: Never Used  Substance and Sexual Activity  . Alcohol use: Yes    Comment: occass.   . Drug use: No  . Sexual activity: Not Currently    Birth control/protection: None  Lifestyle  . Physical activity:    Days per week: Not on file    Minutes per session: Not on file  . Stress: Not on file  Relationships  . Social connections:    Talks on phone: Not on file    Gets together: Not on file    Attends religious  service: Not on file    Active member of club or organization: Not on file    Attends meetings of clubs or organizations: Not on file    Relationship status: Not on file  . Intimate partner violence:    Fear of current or ex partner: Not on file    Emotionally abused: Not on file    Physically abused: Not on file    Forced sexual activity: Not on file  Other Topics Concern  . Not on file  Social History Narrative   Living in [place] with mom. Born and raised in [place] by parents. Pt has 4 older siblings. Pt has graduated HS and did one semester of online college. Pt is working part time in retail. Single, no kids.   Denies any legal problems in the past    Current Outpatient Medications on File Prior to Visit  Medication Sig Dispense Refill  . amphetamine-dextroamphetamine (ADDERALL) 20 MG tablet Take 20 mg by mouth daily.    Marland Kitchen buPROPion (WELLBUTRIN XL) 300 MG 24 hr tablet Take 1 tablet (300 mg total) by mouth daily. 90 tablet 0  . gabapentin (NEURONTIN) 300 MG capsule  Take 300 mg by mouth 2 (two) times daily.     No current facility-administered medications on file prior to visit.     No Known Allergies  Family History  Problem Relation Age of Onset  . Depression Mother   . Depression Sister   . Depression Brother   . Alcohol abuse Maternal Uncle   . Bipolar disorder Father   . ADD / ADHD Father   . Other Neg Hx        transgender    BP 130/88 (BP Location: Left Arm, Patient Position: Sitting, Cuff Size: Normal)   Pulse 88   Ht 5' 7.75" (1.721 m)   Wt 129 lb 6.4 oz (58.7 kg)   SpO2 97%   BMI 19.82 kg/m     Review of Systems Denies weight change, headache, visual loss, chest pain, sob, nausea, hematuria, easy bruising, seizure, edema, myalgias, heat intolerance, rhinorrhea, and excessive diaphoresis.  anxiety is well-controlled.  He has no menstruation     Objective:   Physical Exam  VS: see vs page GEN: no distress HEAD: head: no deformity.  Moderate terminal  hair on the face.  eyes: no periorbital swelling, no proptosis external nose and ears are normal mouth: no lesion seen NECK: supple, thyroid is not enlarged CHEST WALL: no deformity LUNGS: clear to auscultation BREASTS: declined.   CV: reg rate and rhythm, no murmur ABD: abdomen is soft, nontender.  no hepatosplenomegaly.  not distended.  no hernia.   GENITALIA: declined   MUSCULOSKELETAL: muscle bulk and strength are grossly normal.  no obvious joint swelling.  gait is normal and steady.   EXTEMITIES: no leg edema PULSES: no carotid bruit NEURO: sensation is intact to touch on all 4's SKIN:  Normal texture and temperature.  No rash or suspicious lesion is visible.   NODES:  None palpable at the neck PSYCH: alert, well-oriented.  Does not appear anxious nor depressed.    Lab Results  Component Value Date   WBC 4.6 02/26/2018   HGB 16.0 (H) 02/26/2018   HCT 48.0 (H) 02/26/2018   MCV 88.2 02/26/2018   PLT 260 02/26/2018   Lab Results  Component Value Date   TESTOSTERONE 1,206 02/26/2018   I have reviewed outside records, and summarized: Pt was noted to have testosterone, and referred here.  Anxiety and depression probs were addressed      Assessment & Plan:  transgender state, new to me.  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  We discussed risks of this.   Patient Instructions  Please change the testosterone to 60 mg every week.   Please redo the blood tests in 4-6 weeks.   Testosterone treatment has risks, including increased or decreased fertility (depending on the type of treatment), hair loss, prostate cancer, benign prostate enlargement, blood clots, liver problems, lower hdl ("good cholesterol"), polycythemia (opposite of anemia), sleep apnea, and behavior changes.   Please come back for a follow-up appointment in 1 year.

## 2018-05-15 NOTE — Telephone Encounter (Signed)
Please call pt

## 2018-05-15 NOTE — Telephone Encounter (Signed)
Called pt to make aware. LVM requesting returned call.

## 2018-05-15 NOTE — Telephone Encounter (Signed)
2nd attempt to reach pt. LVM requesting returned call. 

## 2018-05-16 ENCOUNTER — Telehealth: Payer: Self-pay | Admitting: Endocrinology

## 2018-05-16 LAB — TESTOSTERONE,FREE AND TOTAL
TESTOSTERONE FREE: 26.1 pg/mL — AB (ref 0.0–4.2)
TESTOSTERONE: 1282 ng/dL — AB (ref 8–48)

## 2018-05-16 NOTE — Telephone Encounter (Signed)
Final attempt to reach pt unsuccessful. LVM requesting returned call. Letter with lab results and Dr. George HughEllison's instructions mailed.

## 2018-05-16 NOTE — Telephone Encounter (Signed)
Margaret Marshall MRN [id number] - returning your call from earlier. Regarding lab results and Dr Henderson CloudEllisons instructions.  Patient states will be off work if you could call after that time they are able to answer

## 2018-05-17 NOTE — Telephone Encounter (Signed)
Spoke with the patient and went over lab results again-patient will have labs drawn in 1 month at a facility closer to home

## 2018-05-27 DIAGNOSIS — R69 Illness, unspecified: Secondary | ICD-10-CM | POA: Diagnosis not present

## 2018-05-27 DIAGNOSIS — F3341 Major depressive disorder, recurrent, in partial remission: Secondary | ICD-10-CM | POA: Diagnosis not present

## 2018-05-27 DIAGNOSIS — F3130 Bipolar disorder, current episode depressed, mild or moderate severity, unspecified: Secondary | ICD-10-CM | POA: Diagnosis not present

## 2018-06-10 DIAGNOSIS — F3130 Bipolar disorder, current episode depressed, mild or moderate severity, unspecified: Secondary | ICD-10-CM | POA: Diagnosis not present

## 2018-06-10 DIAGNOSIS — R69 Illness, unspecified: Secondary | ICD-10-CM | POA: Diagnosis not present

## 2018-06-10 DIAGNOSIS — F3341 Major depressive disorder, recurrent, in partial remission: Secondary | ICD-10-CM | POA: Diagnosis not present

## 2018-06-12 ENCOUNTER — Other Ambulatory Visit: Payer: Self-pay | Admitting: Endocrinology

## 2018-06-12 NOTE — Telephone Encounter (Signed)
Please refill if appropriate

## 2018-06-13 ENCOUNTER — Other Ambulatory Visit: Payer: Self-pay | Admitting: Endocrinology

## 2018-06-13 NOTE — Telephone Encounter (Signed)
Called pt to inquire further but was unsuccessful in my attempt. From previous entries, pt has been difficult to reach in the past. It appears pt was also ordered to have repeat testing 4-6 weeks from 05/14/18. It appears pt would be due now for repeat testing. I have mailed another letter as a reminder and to inquire about refill request.

## 2018-06-13 NOTE — Telephone Encounter (Signed)
This was refilled on 05/14/18.  Did he get this refill?

## 2018-06-13 NOTE — Telephone Encounter (Signed)
Letter mailed

## 2018-06-14 ENCOUNTER — Telehealth: Payer: Self-pay

## 2018-06-14 NOTE — Telephone Encounter (Signed)
Pt requesting refill on testosterone please advise

## 2018-06-14 NOTE — Telephone Encounter (Signed)
Was refilled 05/14/18.  Did pharmacy get this refill?  Did pt claim the refill?

## 2018-06-17 NOTE — Telephone Encounter (Signed)
Pt needs to redo blood test in order to refill.

## 2018-06-17 NOTE — Telephone Encounter (Signed)
pharmacy did not receive, last refill was 03/11/18 please re-send

## 2018-06-17 NOTE — Telephone Encounter (Signed)
lft pt vm informing him of request and that orders had already been placed and that he just needed to be scheduled for blood work

## 2018-06-20 DIAGNOSIS — F3341 Major depressive disorder, recurrent, in partial remission: Secondary | ICD-10-CM | POA: Diagnosis not present

## 2018-06-20 DIAGNOSIS — R69 Illness, unspecified: Secondary | ICD-10-CM | POA: Diagnosis not present

## 2018-06-20 DIAGNOSIS — F3130 Bipolar disorder, current episode depressed, mild or moderate severity, unspecified: Secondary | ICD-10-CM | POA: Diagnosis not present

## 2018-06-21 ENCOUNTER — Other Ambulatory Visit: Payer: 59

## 2018-06-21 DIAGNOSIS — F640 Transsexualism: Secondary | ICD-10-CM

## 2018-06-21 DIAGNOSIS — Z789 Other specified health status: Secondary | ICD-10-CM

## 2018-06-21 DIAGNOSIS — R69 Illness, unspecified: Secondary | ICD-10-CM | POA: Diagnosis not present

## 2018-06-21 LAB — CBC WITH DIFFERENTIAL/PLATELET
Absolute Monocytes: 383 cells/uL (ref 200–950)
BASOS ABS: 41 {cells}/uL (ref 0–200)
Basophils Relative: 0.9 %
EOS ABS: 140 {cells}/uL (ref 15–500)
Eosinophils Relative: 3.1 %
HCT: 49.5 % — ABNORMAL HIGH (ref 35.0–45.0)
Hemoglobin: 16.4 g/dL — ABNORMAL HIGH (ref 11.7–15.5)
Lymphs Abs: 1616 cells/uL (ref 850–3900)
MCH: 28.8 pg (ref 27.0–33.0)
MCHC: 33.1 g/dL (ref 32.0–36.0)
MCV: 87 fL (ref 80.0–100.0)
MONOS PCT: 8.5 %
MPV: 10.8 fL (ref 7.5–12.5)
Neutro Abs: 2322 cells/uL (ref 1500–7800)
Neutrophils Relative %: 51.6 %
Platelets: 292 10*3/uL (ref 140–400)
RBC: 5.69 10*6/uL — ABNORMAL HIGH (ref 3.80–5.10)
RDW: 13 % (ref 11.0–15.0)
Total Lymphocyte: 35.9 %
WBC: 4.5 10*3/uL (ref 3.8–10.8)

## 2018-06-21 NOTE — Addendum Note (Signed)
Addended by: Arva ChafeGARCIA, Liliane Mallis M on: 06/21/2018 09:51 AM   Modules accepted: Orders

## 2018-06-22 LAB — TESTOSTERONE,FREE AND TOTAL
TESTOSTERONE FREE: 6.9 pg/mL — AB (ref 0.0–4.2)
TESTOSTERONE: 403 ng/dL — AB (ref 8–48)

## 2018-06-25 ENCOUNTER — Other Ambulatory Visit: Payer: Self-pay | Admitting: Endocrinology

## 2018-06-25 DIAGNOSIS — F640 Transsexualism: Secondary | ICD-10-CM

## 2018-06-25 DIAGNOSIS — Z789 Other specified health status: Secondary | ICD-10-CM

## 2018-07-23 ENCOUNTER — Other Ambulatory Visit (INDEPENDENT_AMBULATORY_CARE_PROVIDER_SITE_OTHER): Payer: 59

## 2018-07-23 DIAGNOSIS — R69 Illness, unspecified: Secondary | ICD-10-CM | POA: Diagnosis not present

## 2018-07-23 DIAGNOSIS — F640 Transsexualism: Secondary | ICD-10-CM

## 2018-07-23 DIAGNOSIS — Z789 Other specified health status: Secondary | ICD-10-CM

## 2018-07-23 LAB — CBC WITH DIFFERENTIAL/PLATELET
Absolute Monocytes: 315 cells/uL (ref 200–950)
Basophils Absolute: 38 cells/uL (ref 0–200)
Basophils Relative: 0.9 %
Eosinophils Absolute: 139 cells/uL (ref 15–500)
Eosinophils Relative: 3.3 %
HCT: 49.4 % — ABNORMAL HIGH (ref 35.0–45.0)
Hemoglobin: 17.1 g/dL — ABNORMAL HIGH (ref 11.7–15.5)
Lymphs Abs: 1537 cells/uL (ref 850–3900)
MCH: 30.2 pg (ref 27.0–33.0)
MCHC: 34.6 g/dL (ref 32.0–36.0)
MCV: 87.3 fL (ref 80.0–100.0)
MPV: 11 fL (ref 7.5–12.5)
Monocytes Relative: 7.5 %
Neutro Abs: 2171 cells/uL (ref 1500–7800)
Neutrophils Relative %: 51.7 %
Platelets: 285 10*3/uL (ref 140–400)
RBC: 5.66 10*6/uL — ABNORMAL HIGH (ref 3.80–5.10)
RDW: 12.9 % (ref 11.0–15.0)
Total Lymphocyte: 36.6 %
WBC: 4.2 10*3/uL (ref 3.8–10.8)

## 2018-07-23 NOTE — Progress Notes (Signed)
Pt requested that labs be changed over to [facility] because mother works for [employer] and his insurance covers this. Changed CBC with Differential/Platelet, unable to change Testosterone, Free and Total because [facility] was not an option for resulting agency.  Advised the patient to speak with Dr. Everardo All regarding finding a [facility] testing option for future labs.

## 2018-07-23 NOTE — Addendum Note (Signed)
Addended by: Thi Klich M on: 07/23/2018 10:51 AM   Modules accepted: Orders  

## 2018-07-23 NOTE — Addendum Note (Signed)
Addended by: Arva Chafe on: 07/23/2018 10:51 AM   Modules accepted: Orders

## 2018-07-25 ENCOUNTER — Other Ambulatory Visit: Payer: Self-pay | Admitting: Endocrinology

## 2018-07-25 ENCOUNTER — Encounter: Payer: Self-pay | Admitting: Family Medicine

## 2018-07-25 DIAGNOSIS — Z789 Other specified health status: Secondary | ICD-10-CM

## 2018-07-25 DIAGNOSIS — F640 Transsexualism: Secondary | ICD-10-CM

## 2018-07-25 LAB — TESTOSTERONE,FREE AND TOTAL
TESTOSTERONE FREE: 11.1 pg/mL — AB (ref 0.0–4.2)
Testosterone: 558 ng/dL — ABNORMAL HIGH (ref 8–48)

## 2018-07-25 NOTE — Telephone Encounter (Signed)
Yes, please schedule appt.

## 2018-07-27 ENCOUNTER — Other Ambulatory Visit: Payer: Self-pay | Admitting: Endocrinology

## 2018-07-29 ENCOUNTER — Ambulatory Visit (INDEPENDENT_AMBULATORY_CARE_PROVIDER_SITE_OTHER): Payer: 59

## 2018-07-29 ENCOUNTER — Ambulatory Visit: Payer: 59 | Admitting: Family Medicine

## 2018-07-29 ENCOUNTER — Encounter: Payer: Self-pay | Admitting: Family Medicine

## 2018-07-29 VITALS — BP 130/82 | HR 91 | Temp 98.1°F | Ht 67.75 in | Wt 126.6 lb

## 2018-07-29 DIAGNOSIS — M542 Cervicalgia: Secondary | ICD-10-CM | POA: Insufficient documentation

## 2018-07-29 MED ORDER — METHYLPREDNISOLONE 4 MG PO TBPK: ORAL_TABLET | ORAL | 0 refills | Status: DC

## 2018-07-29 NOTE — Telephone Encounter (Signed)
Refill is not due yet 

## 2018-07-29 NOTE — Telephone Encounter (Signed)
Please refill if appropriate

## 2018-07-29 NOTE — Assessment & Plan Note (Signed)
-  Rx for medrol dosepak -Given HEP -Xray requested.

## 2018-07-29 NOTE — Progress Notes (Signed)
Margaret Marshall - 23 y.o. adult MRN [id number]  Date of birth: 01/03/1996  Subjective Chief Complaint  Patient presents with  . Pain    Right side of neck and shoulder pain/ dull ache , comes and goes frequently/ 57month/ no injury    HPI Margaret Marshall is a 23 y.o. adult here today with complaint of neck pain.  He reports that symptoms started about 1 month ago and have not really improved since that time. Describes pain as a dull ache that extends from mid neck with radiation into the R shoulder and arm.  He denies weakness, numbness or tingling.  Worsens some if sleeping in awkward position.  He has not tried anything for treatment at home.  He denies fever, chills.    ROS:  A comprehensive ROS was completed and negative except as noted per HPI  No Known Allergies  Past Medical History:  Diagnosis Date  . Anxiety   . Depression   . Mood disorder [facility])     Past Surgical History:  Procedure Laterality Date  . NO PAST SURGERIES      Social History   Socioeconomic History  . Marital status: Single    Spouse name: Not on file  . Number of children: Not on file  . Years of education: Not on file  . Highest education level: Not on file  Occupational History  . Not on file  Social Needs  . Financial resource strain: Not on file  . Food insecurity:    Worry: Not on file    Inability: Not on file  . Transportation needs:    Medical: Not on file    Non-medical: Not on file  Tobacco Use  . Smoking status: Never Smoker  . Smokeless tobacco: Never Used  Substance and Sexual Activity  . Alcohol use: Yes    Comment: occass.   . Drug use: No  . Sexual activity: Not Currently    Birth control/protection: None  Lifestyle  . Physical activity:    Days per week: Not on file    Minutes per session: Not on file  . Stress: Not on file  Relationships  . Social connections:    Talks on phone: Not on file    Gets together: Not on file    Attends religious service: Not on file   Active member of club or organization: Not on file    Attends meetings of clubs or organizations: Not on file    Relationship status: Not on file  Other Topics Concern  . Not on file  Social History Narrative   Living in [place] with mom. Born and raised in [place] by parents. Pt has 4 older siblings. Pt has graduated HS and did one semester of online college. Pt is working part time in retail. Single, no kids.   Denies any legal problems in the past    Family History  Problem Relation Age of Onset  . Depression Mother   . Depression Sister   . Depression Brother   . Alcohol abuse Maternal Uncle   . Bipolar disorder Father   . ADD / ADHD Father   . Other Neg Hx        transgender    Health Maintenance  Topic Date Due  . PAP-Cervical Cytology Screening  04/27/2017  . PAP SMEAR-Modifier  04/27/2017  . HIV Screening  02/27/2019 (Originally 04/28/2011)  . TETANUS/TDAP  03/13/2028  . INFLUENZA VACCINE  Completed    ----------------------------------------------------------------------------------------------------------------------------------------------------------------------------------------------------------------- Physical Exam BP 130/82  Pulse 91   Temp 98.1 F (36.7 C) (Oral)   Ht 5' 7.75" (1.721 m)   Wt 126 lb 9.6 oz (57.4 kg)   SpO2 97%   BMI 19.39 kg/m   Physical Exam Constitutional:      Appearance: Normal appearance.  HENT:     Head: Normocephalic and atraumatic.     Mouth/Throat:     Mouth: Mucous membranes are moist.  Eyes:     General: No scleral icterus. Neck:     Musculoskeletal: Normal range of motion. No muscular tenderness.  Musculoskeletal:     Comments: Mild ttp along lateral R neck and upper trapezius.   Skin:    General: Skin is warm and dry.  Neurological:     General: No focal deficit present.     Mental Status: He is alert.     Motor: No weakness.  Psychiatric:        Mood and Affect: Mood normal.        Behavior: Behavior normal.       ------------------------------------------------------------------------------------------------------------------------------------------------------------------------------------------------------------------- Assessment and Plan  Neck pain -Rx for medrol dosepak -Given HEP -Xray requested.

## 2018-07-29 NOTE — Addendum Note (Signed)
Addended by: Varney Biles on: 07/29/2018 02:10 PM   Modules accepted: Orders

## 2018-07-29 NOTE — Patient Instructions (Signed)
Cervical Strain and Sprain Rehab Ask your health care provider which exercises are safe for you. Do exercises exactly as told by your health care provider and adjust them as directed. It is normal to feel mild stretching, pulling, tightness, or discomfort as you do these exercises, but you should stop right away if you feel sudden pain or your pain gets worse.Do not begin these exercises until told by your health care provider. Stretching and range of motion exercises These exercises warm up your muscles and joints and improve the movement and flexibility of your neck. These exercises also help to relieve pain, numbness, and tingling. Exercise A: Cervical side bend  1. Using good posture, sit on a stable chair or stand up. 2. Without moving your shoulders, slowly tilt your left / right ear to your shoulder until you feel a stretch in your neck muscles. You should be looking straight ahead. 3. Hold for __________ seconds. 4. Repeat with the other side of your neck. Repeat __________ times. Complete this exercise __________ times a day. Exercise B: Cervical rotation  1. Using good posture, sit on a stable chair or stand up. 2. Slowly turn your head to the side as if you are looking over your left / right shoulder. ? Keep your eyes level with the ground. ? Stop when you feel a stretch along the side and the back of your neck. 3. Hold for __________ seconds. 4. Repeat this by turning to your other side. Repeat __________ times. Complete this exercise __________ times a day. Exercise C: Thoracic extension and pectoral stretch 1. Roll a towel or a small blanket so it is about 4 inches (10 cm) in diameter. 2. Lie down on your back on a firm surface. 3. Put the towel lengthwise, under your spine in the middle of your back. It should not be not under your shoulder blades. The towel should line up with your spine from your middle back to your lower back. 4. Put your hands behind your head and let your  elbows fall out to your sides. 5. Hold for __________ seconds. Repeat __________ times. Complete this exercise __________ times a day. Strengthening exercises These exercises build strength and endurance in your neck. Endurance is the ability to use your muscles for a long time, even after your muscles get tired. Exercise D: Upper cervical flexion, isometric 1. Lie on your back with a thin pillow behind your head and a small rolled-up towel under your neck. 2. Gently tuck your chin toward your chest and nod your head down to look toward your feet. Do not lift your head off the pillow. 3. Hold for __________ seconds. 4. Release the tension slowly. Relax your neck muscles completely before you repeat this exercise. Repeat __________ times. Complete this exercise __________ times a day. Exercise E: Cervical extension, isometric  1. Stand about 6 inches (15 cm) away from a wall, with your back facing the wall. 2. Place a soft object, about 6-8 inches (15-20 cm) in diameter, between the back of your head and the wall. A soft object could be a small pillow, a ball, or a folded towel. 3. Gently tilt your head back and press into the soft object. Keep your jaw and forehead relaxed. 4. Hold for __________ seconds. 5. Release the tension slowly. Relax your neck muscles completely before you repeat this exercise. Repeat __________ times. Complete this exercise __________ times a day. Posture and body mechanics Body mechanics refers to the movements and positions of your   body while you do your daily activities. Posture is part of body mechanics. Good posture and healthy body mechanics can help to relieve stress in your body's tissues and joints. Good posture means that your spine is in its natural S-curve position (your spine is neutral), your shoulders are pulled back slightly, and your head is not tipped forward. The following are general guidelines for applying improved posture and body mechanics to your  everyday activities. Standing   When standing, keep your spine neutral and keep your feet about hip-width apart. Keep a slight bend in your knees. Your ears, shoulders, and hips should line up.  When you do a task in which you stand in one place for a long time, place one foot up on a stable object that is 2-4 inches (5-10 cm) high, such as a footstool. This helps keep your spine neutral. Sitting   When sitting, keep your spine neutral and your keep feet flat on the floor. Use a footrest, if necessary, and keep your thighs parallel to the floor. Avoid rounding your shoulders, and avoid tilting your head forward.  When working at a desk or a computer, keep your desk at a height where your hands are slightly lower than your elbows. Slide your chair under your desk so you are close enough to maintain good posture.  When working at a computer, place your monitor at a height where you are looking straight ahead and you do not have to tilt your head forward or downward to look at the screen. Resting When lying down and resting, avoid positions that are most painful for you. Try to support your neck in a neutral position. You can use a contour pillow or a small rolled-up towel. Your pillow should support your neck but not push on it. This information is not intended to replace advice given to you by your health care provider. Make sure you discuss any questions you have with your health care provider. Document Released: 06/12/2005 Document Revised: 02/17/2016 Document Reviewed: 05/19/2015 Elsevier Interactive Patient Education  2019 Elsevier Inc.  

## 2018-07-30 ENCOUNTER — Other Ambulatory Visit: Payer: Self-pay | Admitting: Endocrinology

## 2018-07-30 NOTE — Telephone Encounter (Signed)
Refill is not due yet 

## 2018-07-30 NOTE — Telephone Encounter (Signed)
Called pt and left detailed VM informing request to refill is TOO SOON.

## 2018-07-30 NOTE — Telephone Encounter (Signed)
I will be happy to call pt to make aware. When will it be due for refill?

## 2018-07-30 NOTE — Telephone Encounter (Signed)
Please refill if appropriate

## 2018-07-30 NOTE — Telephone Encounter (Signed)
Pt called back to state that the advise he/she was provided was not correct. States he/she has been taking medication as prescribed. Further asked, if the medication had been Rx'd 05/14/18, why is the refill not available until June? Pt is asking for further clarification. Message routed to Dr. Everardo All.

## 2018-07-30 NOTE — Telephone Encounter (Signed)
Will be due in June

## 2018-07-30 NOTE — Telephone Encounter (Signed)
Per [facility] "Caller states that he would like Dr. Everardo All to call in a refill of her testosterone to his pharmacy. He tried to do it on MyChart but it would not allow him to."

## 2018-08-01 ENCOUNTER — Encounter: Payer: Self-pay | Admitting: Endocrinology

## 2018-08-01 NOTE — Telephone Encounter (Signed)
Called pt and informed of refill date. Education provided re: importance in reviewing MyChart for lab results. Often, recommendations pertaining to medication dosage changes will be sent using this method. Pt did admit to miscalculating doses and taking 0.2 rather than 0.15 q week. Verbalized acceptance and understanding of the education provided.

## 2018-08-01 NOTE — Telephone Encounter (Signed)
Called [facility] and spoke with pharmacist Shawn. States pt picked up Rx 03/11/18 for 48mL and 06/25/18 for 59mL. Further added 05/14/18 Rx was never received and therefore never filled. Please advise

## 2018-08-01 NOTE — Telephone Encounter (Signed)
please call patient: We still need to know why 10 cc is gone so soon?

## 2018-08-01 NOTE — Telephone Encounter (Signed)
please call patient: For safety and laws governing controlled substances, we cannot refill early, based on most recent dosage adjustment.  Therefore, your next refill is due approx 09/03/18.

## 2018-08-01 NOTE — Telephone Encounter (Signed)
Please advise 

## 2018-08-29 ENCOUNTER — Other Ambulatory Visit: Payer: Self-pay | Admitting: Endocrinology

## 2018-08-29 NOTE — Telephone Encounter (Signed)
Please advise if refill is appropriate 

## 2018-09-02 ENCOUNTER — Encounter: Payer: Self-pay | Admitting: Endocrinology

## 2018-09-03 NOTE — Telephone Encounter (Signed)
Please advise 

## 2018-09-04 ENCOUNTER — Other Ambulatory Visit: Payer: Self-pay

## 2018-09-04 ENCOUNTER — Other Ambulatory Visit (INDEPENDENT_AMBULATORY_CARE_PROVIDER_SITE_OTHER): Payer: 59

## 2018-09-04 DIAGNOSIS — F640 Transsexualism: Secondary | ICD-10-CM | POA: Diagnosis not present

## 2018-09-04 DIAGNOSIS — Z789 Other specified health status: Secondary | ICD-10-CM

## 2018-09-04 LAB — CBC WITH DIFFERENTIAL/PLATELET
ABSOLUTE MONOCYTES: 320 {cells}/uL (ref 200–950)
Basophils Absolute: 41 cells/uL (ref 0–200)
Basophils Relative: 1 %
Eosinophils Absolute: 111 cells/uL (ref 15–500)
Eosinophils Relative: 2.7 %
HCT: 45.7 % — ABNORMAL HIGH (ref 35.0–45.0)
Hemoglobin: 15.7 g/dL — ABNORMAL HIGH (ref 11.7–15.5)
Lymphs Abs: 1570 cells/uL (ref 850–3900)
MCH: 29.9 pg (ref 27.0–33.0)
MCHC: 34.4 g/dL (ref 32.0–36.0)
MCV: 87 fL (ref 80.0–100.0)
MPV: 10.6 fL (ref 7.5–12.5)
Monocytes Relative: 7.8 %
Neutro Abs: 2058 cells/uL (ref 1500–7800)
Neutrophils Relative %: 50.2 %
Platelets: 281 10*3/uL (ref 140–400)
RBC: 5.25 10*6/uL — ABNORMAL HIGH (ref 3.80–5.10)
RDW: 12.8 % (ref 11.0–15.0)
TOTAL LYMPHOCYTE: 38.3 %
WBC: 4.1 10*3/uL (ref 3.8–10.8)

## 2018-09-04 NOTE — Addendum Note (Signed)
Addended by: Arva Chafe on: 09/04/2018 10:59 AM   Modules accepted: Orders

## 2018-09-04 NOTE — Progress Notes (Signed)
  Patient requested that labs be changed to [facility] due to insurance purposes. Lab tech was able to change CBCD but unable to change other test. Advised patient to contact Dr. George Hugh office regarding finding [facility] test equivalent

## 2018-09-05 ENCOUNTER — Other Ambulatory Visit: Payer: Self-pay | Admitting: Endocrinology

## 2018-09-05 DIAGNOSIS — Z789 Other specified health status: Secondary | ICD-10-CM

## 2018-09-05 DIAGNOSIS — F640 Transsexualism: Secondary | ICD-10-CM

## 2018-09-05 MED ORDER — TESTOSTERONE CYPIONATE 100 MG/ML IM SOLN: 20.0000 mg | INTRAMUSCULAR | 0 refills | Status: DC

## 2018-09-06 LAB — TESTOSTERONE,FREE AND TOTAL
Testosterone, Free: 1.3 pg/mL (ref 0.0–4.2)
Testosterone: 15 ng/dL (ref 8–48)

## 2018-10-30 ENCOUNTER — Encounter: Payer: Self-pay | Admitting: Endocrinology

## 2018-10-30 NOTE — Telephone Encounter (Signed)
Please advise 

## 2018-10-31 ENCOUNTER — Ambulatory Visit (INDEPENDENT_AMBULATORY_CARE_PROVIDER_SITE_OTHER): Payer: 59 | Admitting: Endocrinology

## 2018-10-31 ENCOUNTER — Encounter: Payer: Self-pay | Admitting: Endocrinology

## 2018-10-31 DIAGNOSIS — F640 Transsexualism: Secondary | ICD-10-CM

## 2018-10-31 DIAGNOSIS — Z789 Other specified health status: Secondary | ICD-10-CM

## 2018-10-31 MED ORDER — TAMOXIFEN CITRATE 10 MG PO TABS: 10.0000 mg | ORAL_TABLET | Freq: Two times a day (BID) | ORAL | 3 refills | Status: DC

## 2018-10-31 NOTE — Progress Notes (Signed)
Subjective:    Patient ID: Margaret Marshall, adult    DOB: 09-Oct-1995, 23 y.o.   MRN: [id number]  HPI telehealth visit today via Phone x 11 minutes Alternatives to telehealth are presented to this patient, and the patient agrees to the telehealth visit. Pt is advised of the cost of the visit, and agrees to this, also.   Patient is at home, and I am at the office.   Persons attending the telehealth visit: the patient and I Pt returns for f/u of transgender state (F to M; he first suspected the transgender state at age 58; he had puberty at the age of 36; he has never had transgender surgery; he [facility] counselor [facility] the [facility]; he has been on testosterone treatmtent since 2017, but dosage has been limited by polycythemia; he has only taken injections.  He has no children.  He has no h/o DVT, dyslipidemia, liver disease, kidney disease, migraine, sleep apnea, heart disease, CVA, diabetes, smoking, HTN, epilepsy, violent behavior, gallbladder disease, blood disorders, osteoporosis, or cancer).  He gets free labs at [facility].  He says testosterone has helped with depression.  Menses have resumed, with assoc pelvic cramps.  Acne is the same.  hair growth is increasing.  Past Medical History:  Diagnosis Date  . Anxiety   . Depression   . Mood disorder [facility])     Past Surgical History:  Procedure Laterality Date  . NO PAST SURGERIES      Social History   Socioeconomic History  . Marital status: Single    Spouse name: Not on file  . Number of children: Not on file  . Years of education: Not on file  . Highest education level: Not on file  Occupational History  . Not on file  Social Needs  . Financial resource strain: Not on file  . Food insecurity:    Worry: Not on file    Inability: Not on file  . Transportation needs:    Medical: Not on file    Non-medical: Not on file  Tobacco Use  . Smoking status: Never Smoker  . Smokeless tobacco: Never Used  Substance and Sexual  Activity  . Alcohol use: Yes    Comment: occass.   . Drug use: No  . Sexual activity: Not Currently    Birth control/protection: None  Lifestyle  . Physical activity:    Days per week: Not on file    Minutes per session: Not on file  . Stress: Not on file  Relationships  . Social connections:    Talks on phone: Not on file    Gets together: Not on file    Attends religious service: Not on file    Active member of club or organization: Not on file    Attends meetings of clubs or organizations: Not on file    Relationship status: Not on file  . Intimate partner violence:    Fear of current or ex partner: Not on file    Emotionally abused: Not on file    Physically abused: Not on file    Forced sexual activity: Not on file  Other Topics Concern  . Not on file  Social History Narrative   Living in [place] with mom. Born and raised in [place] by parents. Pt has 4 older siblings. Pt has graduated HS and did one semester of online college. Pt is working part time in retail. Single, no kids.   Denies any legal problems in the past  Current Outpatient Medications on File Prior to Visit  Medication Sig Dispense Refill  . amphetamine-dextroamphetamine (ADDERALL) 20 MG tablet     . buPROPion (WELLBUTRIN XL) 300 MG 24 hr tablet Take 1 tablet (300 mg total) by mouth daily. 90 tablet 0  . gabapentin (NEURONTIN) 300 MG capsule Take 300 mg by mouth 2 (two) times daily.    Marland Kitchen. testosterone cypionate (DEPOTESTOTERONE CYPIONATE) 100 MG/ML injection Inject 0.2 mLs (20 mg total) into the muscle once a week. And syringes 1/week 10 mL 0   No current facility-administered medications on file prior to visit.     No Known Allergies  Family History  Problem Relation Age of Onset  . Depression Mother   . Depression Sister   . Depression Brother   . Alcohol abuse Maternal Uncle   . Bipolar disorder Father   . ADD / ADHD Father   . Other Neg Hx        transgender    Review of Systems Denies  leg swelling.      Objective:   Physical Exam      Assessment & Plan:  Transgender state: due for recheck Menstruation: we'll suppress this with tamoxifen  Patient Instructions  Blood tests are requested for you today.  We'll let you know about the results.  I have sent a prescription to your pharmacy, to work against estrogen.  Please start taking after you have the blood drawn.  Please have a follow-up appointment in 6 months.

## 2018-10-31 NOTE — Patient Instructions (Addendum)
Blood tests are requested for you today.  We'll let you know about the results.  I have sent a prescription to your pharmacy, to work against estrogen.  Please start taking after you have the blood drawn.  Please have a follow-up appointment in 6 months.

## 2018-11-07 ENCOUNTER — Other Ambulatory Visit: Payer: 59

## 2018-11-07 ENCOUNTER — Other Ambulatory Visit: Payer: Self-pay

## 2018-11-07 DIAGNOSIS — F640 Transsexualism: Secondary | ICD-10-CM

## 2018-11-07 DIAGNOSIS — Z789 Other specified health status: Secondary | ICD-10-CM

## 2018-11-08 LAB — CBC WITH DIFFERENTIAL/PLATELET
Absolute Monocytes: 352 cells/uL (ref 200–950)
Basophils Absolute: 61 cells/uL (ref 0–200)
Basophils Relative: 1.1 %
Eosinophils Absolute: 143 cells/uL (ref 15–500)
Eosinophils Relative: 2.6 %
HCT: 44.4 % (ref 35.0–45.0)
Hemoglobin: 15.1 g/dL (ref 11.7–15.5)
Lymphs Abs: 1617 cells/uL (ref 850–3900)
MCH: 30.1 pg (ref 27.0–33.0)
MCHC: 34 g/dL (ref 32.0–36.0)
MCV: 88.4 fL (ref 80.0–100.0)
MPV: 11.6 fL (ref 7.5–12.5)
Monocytes Relative: 6.4 %
Neutro Abs: 3328 cells/uL (ref 1500–7800)
Neutrophils Relative %: 60.5 %
Platelets: 307 10*3/uL (ref 140–400)
RBC: 5.02 10*6/uL (ref 3.80–5.10)
RDW: 13 % (ref 11.0–15.0)
Total Lymphocyte: 29.4 %
WBC: 5.5 10*3/uL (ref 3.8–10.8)

## 2018-11-10 LAB — TESTOSTERONE,FREE AND TOTAL
Testosterone, Free: 4.6 pg/mL — ABNORMAL HIGH (ref 0.0–4.2)
Testosterone: 327 ng/dL — ABNORMAL HIGH (ref 8–48)

## 2018-11-14 LAB — CP TESTOSTERONE, BIO-FEMALE/CHILDREN
Albumin: 4.9 g/dL (ref 3.6–5.1)
Sex Hormone Binding: 77 nmol/L (ref 17–124)
TESTOSTERONE, BIOAVAILABLE: 45.8 ng/dL — ABNORMAL HIGH (ref 0.5–8.5)
Testosterone, Free: 20.5 pg/mL — ABNORMAL HIGH (ref 0.2–5.0)
Testosterone, Total, LC-MS-MS: 341 ng/dL — ABNORMAL HIGH (ref 2–45)

## 2018-11-14 LAB — ESTRADIOL, FREE
Estradiol, Free: 1.39 pg/mL
Estradiol: 83 pg/mL

## 2018-11-21 ENCOUNTER — Telehealth: Payer: 59 | Admitting: Physician Assistant

## 2018-11-21 DIAGNOSIS — Z20822 Contact with and (suspected) exposure to covid-19: Secondary | ICD-10-CM

## 2018-11-21 NOTE — Progress Notes (Signed)
I have spent 5 minutes in review of e-visit questionnaire, review and updating patient chart, medical decision making and response to patient.   Flara Storti Cody Mehlani Blankenburg, PA-C    

## 2018-11-21 NOTE — Progress Notes (Signed)
E-Visit for Corona Virus Screening  Based on your current symptoms, you may very well have the virus, however your symptoms are mild. Currently, not all patients are being tested. If the symptoms are mild and there is not a known exposure, performing the test is not indicated.  You have been enrolled in MyChart Home Monitoring for COVID-19. Daily you will receive a questionnaire within the MyChart website. Our COVID-19 response team will be monitoring your responses daily.   Coronavirus disease 2019 (COVID-19) is a respiratory illness that can spread from person to person. The virus that causes COVID-19 is a new virus that was first identified in the country of [place] but is now found in multiple other countries and has spread to the [place].  Symptoms associated with the virus are mild to severe fever, cough, and shortness of breath. There is currently no vaccine to protect against COVID-19, and there is no specific antiviral treatment for the virus.   To be considered HIGH RISK for Coronavirus (COVID-19), you have to meet the following criteria:  . Traveled to [place], [place], [place], [place] or [place]; or in the [place] to [place], [place], [place], or [place]; and have fever, cough, and shortness of breath within the last 2 weeks of travel OR  . Been in close contact with a person diagnosed with COVID-19 within the last 2 weeks and have fever, cough, and shortness of breath  . IF YOU DO NOT MEET THESE CRITERIA, YOU ARE CONSIDERED LOW RISK FOR COVID-19.   It is vitally important that if you feel that you have an infection such as this virus or any other virus that you stay home and away from places where you may spread it to others.  You should self-quarantine for 14 days if you have symptoms that could potentially be coronavirus and avoid contact with people age 21 and older.   You may take acetaminophen (Tylenol) as needed for fever.   Reduce your risk of any infection by  using the same precautions used for avoiding the common cold or flu:  Marland Kitchen Wash your hands often with soap and warm water for at least 20 seconds.  If soap and water are not readily available, use an alcohol-based hand sanitizer with at least 60% alcohol.  . If coughing or sneezing, cover your mouth and nose by coughing or sneezing into the elbow areas of your shirt or coat, into a tissue or into your sleeve (not your hands). . Avoid shaking hands with others and consider head nods or verbal greetings only. . Avoid touching your eyes, nose, or mouth with unwashed hands.  . Avoid close contact with people who are sick. . Avoid places or events with large numbers of people in one location, like concerts or sporting events. . Carefully consider travel plans you have or are making. . If you are planning any travel outside or inside the [place], visit the CDC's Travelers' Health webpage for the latest health notices. . If you have some symptoms but not all symptoms, continue to monitor at home and seek medical attention if your symptoms worsen. . If you are having a medical emergency, call 911.  HOME CARE . Only take medications as instructed by your medical team. . Drink plenty of fluids and get plenty of rest. . A steam or ultrasonic humidifier can help if you have congestion.   GET HELP RIGHT AWAY IF: . You develop worsening fever. . You become short of breath .  You cough up blood. . Your symptoms become more severe MAKE SURE YOU   Understand these instructions.  Will watch your condition.  Will get help right away if you are not doing well or get worse.  Your e-visit answers were reviewed by a board certified advanced clinical practitioner to complete your personal care plan.  Depending on the condition, your plan could have included both over the counter or prescription medications.  If there is a problem please reply once you have received a response from your provider. Your safety is  important to us.  If you have drug allergies check your prescription carefully.    You can use MyChart to ask questions about today's visit, request a non-urgent call back, or ask for a work or school excuse for 24 hours related to this e-Visit. If it has been greater than 24 hours you will need to follow up with your provider, or enter a new e-Visit to address those concerns. You will get an e-mail in the next two days asking about your experience.  I hope that your e-visit has been valuable and will speed your recovery. Thank you for using e-visits.

## 2018-11-28 ENCOUNTER — Encounter: Payer: Self-pay | Admitting: Endocrinology

## 2018-11-29 NOTE — Telephone Encounter (Signed)
Please advise 

## 2019-01-01 ENCOUNTER — Encounter: Payer: Self-pay | Admitting: Endocrinology

## 2019-01-02 ENCOUNTER — Other Ambulatory Visit: Payer: Self-pay | Admitting: Endocrinology

## 2019-01-02 MED ORDER — ANASTROZOLE 1 MG PO TABS: 1.0000 mg | ORAL_TABLET | Freq: Every day | ORAL | 3 refills | Status: DC

## 2019-01-02 NOTE — Telephone Encounter (Signed)
Please advise 

## 2019-02-19 ENCOUNTER — Encounter: Payer: Self-pay | Admitting: Endocrinology

## 2019-02-19 NOTE — Telephone Encounter (Signed)
Please advise 

## 2019-02-20 ENCOUNTER — Other Ambulatory Visit: Payer: Self-pay | Admitting: Endocrinology

## 2019-02-20 DIAGNOSIS — Z789 Other specified health status: Secondary | ICD-10-CM

## 2019-02-20 DIAGNOSIS — F640 Transsexualism: Secondary | ICD-10-CM

## 2019-02-20 NOTE — Telephone Encounter (Signed)
Please review and advise.

## 2019-02-24 NOTE — Telephone Encounter (Signed)
Please advise 

## 2019-04-26 ENCOUNTER — Other Ambulatory Visit: Payer: Self-pay | Admitting: Endocrinology

## 2019-05-14 ENCOUNTER — Other Ambulatory Visit: Payer: Self-pay

## 2019-05-16 ENCOUNTER — Encounter: Payer: Self-pay | Admitting: Endocrinology

## 2019-05-16 ENCOUNTER — Ambulatory Visit (INDEPENDENT_AMBULATORY_CARE_PROVIDER_SITE_OTHER): Payer: 59 | Admitting: Endocrinology

## 2019-05-16 VITALS — BP 130/78 | HR 113 | Ht 67.75 in | Wt 134.0 lb

## 2019-05-16 DIAGNOSIS — Z789 Other specified health status: Secondary | ICD-10-CM

## 2019-05-16 DIAGNOSIS — F640 Transsexualism: Secondary | ICD-10-CM | POA: Diagnosis not present

## 2019-05-16 MED ORDER — ELAGOLIX SODIUM 150 MG PO TABS: 150.0000 mg | ORAL_TABLET | Freq: Every day | ORAL | 11 refills | Status: DC

## 2019-05-16 NOTE — Patient Instructions (Addendum)
Blood tests are requested for you today.  We'll let you know about the results.  Please continue the same medications. I my opinion, you should consider having the uterus and ovaries removed.   Please come back for a follow-up appointment in 6 months.

## 2019-05-16 NOTE — Progress Notes (Signed)
Subjective:    Patient ID: Margaret Marshall, adult    DOB: Mar 05, 1996, 23 y.o.   MRN: [id number]  HPI Pt returns for f/u of transgender state (F to M; he first suspected the transgender state at age 80; he had puberty at the age of 1; he has never had transgender surgery; he sees counselor at the [facility]; he has been on testosterone treatmtent since 2017, but dosage has been limited by polycythemia; he has only taken injections.  He has no children.  He has no h/o DVT, dyslipidemia, liver disease, kidney disease, migraine, sleep apnea, heart disease, CVA, diabetes, smoking, HTN, epilepsy, violent behavior, gallbladder disease, blood disorders, osteoporosis, or cancer; he gets free labs at [facility]).  He was seen by [facility], who rx'ed Pollie Friar.  On this, menses are now minimal.  He has stopped tamoxifen and anastrazole.   Past Medical History:  Diagnosis Date  . Anxiety   . Depression   . Mood disorder [facility])     Past Surgical History:  Procedure Laterality Date  . NO PAST SURGERIES      Social History   Socioeconomic History  . Marital status: Single    Spouse name: Not on file  . Number of children: Not on file  . Years of education: Not on file  . Highest education level: Not on file  Occupational History  . Not on file  Social Needs  . Financial resource strain: Not on file  . Food insecurity    Worry: Not on file    Inability: Not on file  . Transportation needs    Medical: Not on file    Non-medical: Not on file  Tobacco Use  . Smoking status: Never Smoker  . Smokeless tobacco: Never Used  Substance and Sexual Activity  . Alcohol use: Yes    Comment: occass.   . Drug use: No  . Sexual activity: Not Currently    Birth control/protection: None  Lifestyle  . Physical activity    Days per week: Not on file    Minutes per session: Not on file  . Stress: Not on file  Relationships  . Social Musician on phone: Not on file    Gets together: Not on  file    Attends religious service: Not on file    Active member of club or organization: Not on file    Attends meetings of clubs or organizations: Not on file    Relationship status: Not on file  . Intimate partner violence    Fear of current or ex partner: Not on file    Emotionally abused: Not on file    Physically abused: Not on file    Forced sexual activity: Not on file  Other Topics Concern  . Not on file  Social History Narrative   Living in [place] with mom. Born and raised in [place] by parents. Pt has 4 older siblings. Pt has graduated HS and did one semester of online college. Pt is working part time in retail. Single, no kids.   Denies any legal problems in the past    Current Outpatient Medications on File Prior to Visit  Medication Sig Dispense Refill  . amphetamine-dextroamphetamine (ADDERALL) 20 MG tablet     . testosterone cypionate (DEPOTESTOTERONE CYPIONATE) 100 MG/ML injection Inject 0.2 mLs (20 mg total) into the muscle once a week. And syringes 1/week 10 mL 0   No current facility-administered medications on file prior to visit.  No Known Allergies  Family History  Problem Relation Age of Onset  . Depression Mother   . Depression Sister   . Depression Brother   . Alcohol abuse Maternal Uncle   . Bipolar disorder Father   . ADD / ADHD Father   . Other Neg Hx        transgender    BP 130/78 (BP Location: Right Arm, Patient Position: Sitting, Cuff Size: Normal)   Pulse (!) 113   Ht 5' 7.75" (1.721 m)   Wt 134 lb (60.8 kg)   SpO2 99%   BMI 20.53 kg/m    Review of Systems Denies sob.      Objective:   Physical Exam VITAL SIGNS:  See vs page GENERAL: no distress Ext: no leg edema.       Assessment & Plan:  Transgender state: due to recheck of labs.  Based on the results, I'llrefill the testosterone. Tachycardia: check TFT.   Patient Instructions  Blood tests are requested for you today.  We'll let you know about the results.  Please  continue the same medications. I my opinion, you should consider having the uterus and ovaries removed.   Please come back for a follow-up appointment in 6 months.

## 2019-05-18 LAB — TESTOSTERONE,FREE AND TOTAL
Testosterone, Free: 7.3 pg/mL — ABNORMAL HIGH (ref 0.0–4.2)
Testosterone: 338 ng/dL — ABNORMAL HIGH (ref 8–48)

## 2019-05-21 LAB — CBC WITH DIFFERENTIAL/PLATELET
Absolute Monocytes: 291 cells/uL (ref 200–950)
Basophils Absolute: 49 cells/uL (ref 0–200)
Basophils Relative: 1.2 %
Eosinophils Absolute: 123 cells/uL (ref 15–500)
Eosinophils Relative: 3 %
HCT: 45.3 % — ABNORMAL HIGH (ref 35.0–45.0)
Hemoglobin: 15.3 g/dL (ref 11.7–15.5)
Lymphs Abs: 1173 cells/uL (ref 850–3900)
MCH: 30 pg (ref 27.0–33.0)
MCHC: 33.8 g/dL (ref 32.0–36.0)
MCV: 88.8 fL (ref 80.0–100.0)
MPV: 11.3 fL (ref 7.5–12.5)
Monocytes Relative: 7.1 %
Neutro Abs: 2464 cells/uL (ref 1500–7800)
Neutrophils Relative %: 60.1 %
Platelets: 272 10*3/uL (ref 140–400)
RBC: 5.1 10*6/uL (ref 3.80–5.10)
RDW: 12.3 % (ref 11.0–15.0)
Total Lymphocyte: 28.6 %
WBC: 4.1 10*3/uL (ref 3.8–10.8)

## 2019-05-21 LAB — TSH: TSH: 0.35 mIU/L — ABNORMAL LOW

## 2019-05-21 LAB — ESTRADIOL, FREE
Estradiol, Free: 0.66 pg/mL
Estradiol: 35 pg/mL

## 2019-05-21 LAB — T4, FREE: Free T4: 1.1 ng/dL (ref 0.8–1.8)

## 2019-05-27 ENCOUNTER — Other Ambulatory Visit: Payer: Self-pay | Admitting: Endocrinology

## 2019-05-27 MED ORDER — TESTOSTERONE CYPIONATE 100 MG/ML IM SOLN: 20.0000 mg | INTRAMUSCULAR | 0 refills | Status: DC

## 2019-08-12 ENCOUNTER — Other Ambulatory Visit: Payer: Self-pay

## 2019-08-12 NOTE — Patient Instructions (Addendum)
Health Maintenance Due  Topic Date Due  . HIV Screening  04/28/2011  . PAP-Cervical Cytology Screening  04/27/2017  . PAP SMEAR-Modifier  04/27/2017    No flowsheet data found.   Heating pad 2x/day 15-20 min on then off Stretching exercises daily Ibuprofen 600mg  2x/day x 2-3 days then stop If no/minimal improvement, let me know and we can try Rx muscle relaxant

## 2019-08-13 ENCOUNTER — Ambulatory Visit (INDEPENDENT_AMBULATORY_CARE_PROVIDER_SITE_OTHER): Payer: Managed Care, Other (non HMO) | Admitting: Family Medicine

## 2019-08-13 ENCOUNTER — Encounter: Payer: Self-pay | Admitting: Family Medicine

## 2019-08-13 VITALS — BP 119/80 | HR 91 | Temp 96.9°F | Ht 67.75 in | Wt 131.4 lb

## 2019-08-13 DIAGNOSIS — F640 Transsexualism: Secondary | ICD-10-CM

## 2019-08-13 DIAGNOSIS — Z8352 Family history of ear disorders: Secondary | ICD-10-CM | POA: Diagnosis not present

## 2019-08-13 DIAGNOSIS — Z789 Other specified health status: Secondary | ICD-10-CM

## 2019-08-13 NOTE — Progress Notes (Signed)
Margaret Marshall is a 24 y.o. adult  Chief Complaint  Patient presents with  . Establish Care    .Pt would like to discuss some family hx of MGM and mother.  Pt also would like to discuss some surgery.    HPI: Margaret Marshall is a 24 y.o. adult who is a former patient of Dr. Ashley Royalty.  He is here today to establish care. Pt was born female but identifies as female. He is following with Dr. Everardo All and recently had consultation with [facility] minimally invasive GYN surgery to discuss oophorectomy or hysterectomy. Pt is still unsure if he is going to proceed with this but did find out from insurance that he will need letter of medical necessity from 2 physicians in order for surgery to be covered. He is asking if I would be willing to write one of these letters.   Pt notes health alerts on MyChart about being due for PAP. He understands from a medical standpoint that this is probably advisable but is not sure it is something he is comfortable doing as it does not fit with his identity. He would like alerts removed at this time.   He notes a family h/o Meniere's in mother and MGM. Pt with tinnitus and subjective hearing difficulty at times. He is not sure whether this is due to people wearing masks. He does not want further eval at this time but will f/u PRN if need be.   Past Medical History:  Diagnosis Date  . Anxiety   . Depression   . Mood disorder [facility])     Past Surgical History:  Procedure Laterality Date  . NO PAST SURGERIES      Social History   Socioeconomic History  . Marital status: Single    Spouse name: Not on file  . Number of children: Not on file  . Years of education: Not on file  . Highest education level: Not on file  Occupational History  . Not on file  Tobacco Use  . Smoking status: Never Smoker  . Smokeless tobacco: Never Used  Substance and Sexual Activity  . Alcohol use: Yes    Comment: occass.   . Drug use: No  . Sexual activity: Not Currently    Birth  control/protection: None  Other Topics Concern  . Not on file  Social History Narrative   Living in [place] with mom. Born and raised in [place] by parents. Pt has 4 older siblings. Pt has graduated HS and did one semester of online college. Pt is working part time in retail. Single, no kids.   Denies any legal problems in the past   Social Determinants of Health   Financial Resource Strain:   . Difficulty of Paying Living Expenses: Not on file  Food Insecurity:   . Worried About Programme researcher, broadcasting/film/video in the Last Year: Not on file  . Ran Out of Food in the Last Year: Not on file  Transportation Needs:   . Lack of Transportation (Medical): Not on file  . Lack of Transportation (Non-Medical): Not on file  Physical Activity:   . Days of Exercise per Week: Not on file  . Minutes of Exercise per Session: Not on file  Stress:   . Feeling of Stress : Not on file  Social Connections:   . Frequency of Communication with Friends and Family: Not on file  . Frequency of Social Gatherings with Friends and Family: Not on file  . Attends Religious Services: Not  on file  . Active Member of Clubs or Organizations: Not on file  . Attends Archivist Meetings: Not on file  . Marital Status: Not on file  Intimate Partner Violence:   . Fear of Current or Ex-Partner: Not on file  . Emotionally Abused: Not on file  . Physically Abused: Not on file  . Sexually Abused: Not on file    Family History  Problem Relation Age of Onset  . Depression Mother   . Meniere's disease Mother   . Depression Sister   . Depression Brother   . Alcohol abuse Maternal Uncle   . Bipolar disorder Father   . ADD / ADHD Father   . Meniere's disease Maternal Grandmother   . Other Neg Hx        transgender     Immunization History  Administered Date(s) Administered  . Influenza,inj,Quad PF,6+ Mos 03/13/2018  . Influenza-Unspecified 04/12/2016, 05/23/2017  . PPD Test 03/13/2018  . Tdap 03/13/2018     Outpatient Encounter Medications as of 08/13/2019  Medication Sig  . amphetamine-dextroamphetamine (ADDERALL) 20 MG tablet   . Elagolix Sodium 150 MG TABS Take 150 mg by mouth daily.  Marland Kitchen testosterone cypionate (DEPOTESTOTERONE CYPIONATE) 100 MG/ML injection Inject 0.2 mLs (20 mg total) into the muscle once a week. And syringes 1/week  . sertraline (ZOLOFT) 100 MG tablet Take 100 mg by mouth daily.   No facility-administered encounter medications on file as of 08/13/2019.     ROS: Gen: no fever, chills  Skin: no rash, itching ENT: no ear pain, ear drainage, nasal congestion, rhinorrhea, sinus pressure, sore throat Eyes: no blurry vision, double vision Resp: no cough, wheeze,SOB CV: no CP, palpitations, LE edema,  GI: no heartburn, n/v/d/c, abd pain GU: no dysuria, urgency, frequency, hematuria  MSK: no joint pain, myalgias, back pain Neuro: no dizziness, headache, weakness   No Known Allergies  BP 119/80 (BP Location: Left Arm, Patient Position: Sitting, Cuff Size: Normal)   Pulse 91   Temp (!) 96.9 F (36.1 C) (Temporal)   Ht 5' 7.75" (1.721 m)   Wt 131 lb 6.4 oz (59.6 kg)   SpO2 100%   BMI 20.13 kg/m   Physical Exam  Constitutional: He is oriented to person, place, and time. He appears well-developed and well-nourished. No distress.  Pulmonary/Chest: No respiratory distress.  Neurological: He is alert and oriented to person, place, and time.  Psychiatric: He has a normal mood and affect. His behavior is normal.     A/P:  1. Female-to-female transgender person 2. Gender dysphoria in adult - follows with endo Dr. Loanne Drilling and recently seen in consultation virtually by Dr. William Hamburger with [facility] minimally invasive GYN surgery - I am agreeable to writing letter of medical necessity if pt decides to move forward to surgery. He will let me know. - will postpone PAP health maintenance alerts as requested by patient. I did express to pt that I would be glad to perform PAP if/when he  decides to do this  3. Family history of Meniere's disease - mother and MGM - pt has intermittent issues with subjective decreased hearing - no eval/intervention needed at this time - f/u PRN  I personally spent 40 min with the patient today and greater than 50% was spent in counseling, coordination of care, education   This visit occurred during the SARS-CoV-2 public health emergency.  Safety protocols were in place, including screening questions prior to the visit, additional usage of staff PPE, and extensive cleaning  of exam room while observing appropriate contact time as indicated for disinfecting solutions.

## 2019-09-16 ENCOUNTER — Encounter: Payer: Self-pay | Admitting: Endocrinology

## 2019-10-02 ENCOUNTER — Encounter: Payer: Self-pay | Admitting: Family Medicine

## 2019-10-03 MED ORDER — ELAGOLIX SODIUM 150 MG PO TABS: 150.0000 mg | ORAL_TABLET | Freq: Every day | ORAL | 1 refills | Status: DC

## 2019-11-11 ENCOUNTER — Other Ambulatory Visit: Payer: Self-pay

## 2019-11-13 ENCOUNTER — Other Ambulatory Visit: Payer: Self-pay

## 2019-11-13 ENCOUNTER — Encounter: Payer: Self-pay | Admitting: Endocrinology

## 2019-11-13 ENCOUNTER — Ambulatory Visit: Payer: Managed Care, Other (non HMO) | Admitting: Endocrinology

## 2019-11-13 VITALS — BP 128/80 | HR 74 | Ht 67.75 in | Wt 131.0 lb

## 2019-11-13 DIAGNOSIS — F640 Transsexualism: Secondary | ICD-10-CM | POA: Diagnosis not present

## 2019-11-13 DIAGNOSIS — Z789 Other specified health status: Secondary | ICD-10-CM

## 2019-11-13 LAB — CBC WITH DIFFERENTIAL/PLATELET
Basophils Absolute: 0 10*3/uL (ref 0.0–0.1)
Basophils Relative: 1.1 % (ref 0.0–3.0)
Eosinophils Absolute: 0.1 10*3/uL (ref 0.0–0.7)
Eosinophils Relative: 2.6 % (ref 0.0–5.0)
HCT: 41.1 % (ref 36.0–46.0)
Hemoglobin: 13.8 g/dL (ref 12.0–15.0)
Lymphocytes Relative: 33.2 % (ref 12.0–46.0)
Lymphs Abs: 1.3 10*3/uL (ref 0.7–4.0)
MCHC: 33.6 g/dL (ref 30.0–36.0)
MCV: 89 fl (ref 78.0–100.0)
Monocytes Absolute: 0.4 10*3/uL (ref 0.1–1.0)
Monocytes Relative: 9.1 % (ref 3.0–12.0)
Neutro Abs: 2.2 10*3/uL (ref 1.4–7.7)
Neutrophils Relative %: 54 % (ref 43.0–77.0)
Platelets: 225 10*3/uL (ref 150.0–400.0)
RBC: 4.62 Mil/uL (ref 3.87–5.11)
RDW: 13.3 % (ref 11.5–15.5)
WBC: 4 10*3/uL (ref 4.0–10.5)

## 2019-11-13 LAB — LUTEINIZING HORMONE: LH: 9.24 m[IU]/mL

## 2019-11-13 LAB — FOLLICLE STIMULATING HORMONE: FSH: 5.8 m[IU]/mL

## 2019-11-13 LAB — TSH: TSH: 0.59 u[IU]/mL (ref 0.35–4.50)

## 2019-11-13 LAB — T4, FREE: Free T4: 0.77 ng/dL (ref 0.60–1.60)

## 2019-11-13 NOTE — Patient Instructions (Signed)
Blood tests are requested for you today.  We'll let you know about the results.  Please continue the same medications. I agree with your plan for surgery Please come back for a follow-up appointment in 6 months.

## 2019-11-13 NOTE — Progress Notes (Signed)
Subjective:    Patient ID: Margaret Marshall, Margaret Marshall    DOB: Oct 29, 1995, 24 y.o.   MRN: [id number]  HPI Pt returns for f/u of transgender state (F to M): Surgery: never Medication: testosterone treatmtent since 2017; Elagilix since 2020.   Counseling: sees [facility] Other: dosage has been limited by polycythemia; he gets free labs at [facility].   Interval hx: he takes meds as rx'ed.  pt states he feels well in general.  Last testosterone dose was 4 days ago.  He reports good progress on hair growth.  No change in breast size.   He was also noted to have slight low TSH in 2020.   Past Medical History:  Diagnosis Date  . Anxiety   . Depression   . Mood disorder [facility])     Past Surgical History:  Procedure Laterality Date  . NO PAST SURGERIES      Social History   Socioeconomic History  . Marital status: Single    Spouse name: Not on file  . Number of children: Not on file  . Years of education: Not on file  . Highest education level: Not on file  Occupational History  . Not on file  Tobacco Use  . Smoking status: Never Smoker  . Smokeless tobacco: Never Used  Substance and Sexual Activity  . Alcohol use: Yes    Comment: occass.   . Drug use: No  . Sexual activity: Not Currently    Birth control/protection: None  Other Topics Concern  . Not on file  Social History Narrative   Living in [place] with mom. Born and raised in [place] by parents. Pt has 4 older siblings. Pt has graduated HS and did one semester of online college. Pt is working part time in retail. Single, no kids.   Denies any legal problems in the past   Social Determinants of Health   Financial Resource Strain:   . Difficulty of Paying Living Expenses:   Food Insecurity:   . Worried About Programme researcher, broadcasting/film/video in the Last Year:   . Barista in the Last Year:   Transportation Needs:   . Freight forwarder (Medical):   Marland Kitchen Lack of Transportation (Non-Medical):   Physical Activity:   . Days  of Exercise per Week:   . Minutes of Exercise per Session:   Stress:   . Feeling of Stress :   Social Connections:   . Frequency of Communication with Friends and Family:   . Frequency of Social Gatherings with Friends and Family:   . Attends Religious Services:   . Active Member of Clubs or Organizations:   . Attends Banker Meetings:   Marland Kitchen Marital Status:   Intimate Partner Violence:   . Fear of Current or Ex-Partner:   . Emotionally Abused:   Marland Kitchen Physically Abused:   . Sexually Abused:     Current Outpatient Medications on File Prior to Visit  Medication Sig Dispense Refill  . amphetamine-dextroamphetamine (ADDERALL) 20 MG tablet     . Elagolix Sodium 150 MG TABS Take 150 mg by mouth daily. 90 tablet 1  . sertraline (ZOLOFT) 100 MG tablet Take 100 mg by mouth daily.    Marland Kitchen testosterone cypionate (DEPOTESTOTERONE CYPIONATE) 100 MG/ML injection Inject 0.2 mLs (20 mg total) into the muscle once a week. And syringes 1/week 10 mL 0   No current facility-administered medications on file prior to visit.    No Known Allergies  Family History  Problem Relation Age of Onset  . Depression Mother   . Meniere's disease Mother   . Depression Sister   . Depression Brother   . Alcohol abuse Maternal Uncle   . Bipolar disorder Father   . ADD / ADHD Father   . Meniere's disease Maternal Grandmother   . Other Neg Hx        transgender    BP 128/80   Pulse 74   Ht 5' 7.75" (1.721 m)   Wt 131 lb (59.4 kg)   SpO2 98%   BMI 20.07 kg/m     Review of Systems Denies sob.     Objective:   Physical Exam VITAL SIGNS:  See vs page GENERAL: no distress NECK: There is no palpable thyroid enlargement.  No thyroid nodule is palpable.  No palpable lymphadenopathy at the anterior neck. EXT: no leg edema     Assessment & Plan:  Transgender state: check labs today.  Hyperthyroidism, mild, uncertain etiology.  Recheck today  Patient Instructions  Blood tests are requested for  you today.  We'll let you know about the results.  Please continue the same medications. I agree with your plan for surgery Please come back for a follow-up appointment in 6 months.

## 2019-11-17 LAB — TESTOSTERONE,FREE AND TOTAL
Testosterone, Free: 5.3 pg/mL — ABNORMAL HIGH (ref 0.0–4.2)
Testosterone: 362 ng/dL — ABNORMAL HIGH (ref 13–71)

## 2019-11-20 LAB — ESTRADIOL, FREE
Estradiol, Free: 2.19 pg/mL
Estradiol: 113 pg/mL

## 2019-11-25 ENCOUNTER — Encounter: Payer: Self-pay | Admitting: Family Medicine

## 2019-11-25 DIAGNOSIS — F332 Major depressive disorder, recurrent severe without psychotic features: Secondary | ICD-10-CM

## 2019-11-25 DIAGNOSIS — F411 Generalized anxiety disorder: Secondary | ICD-10-CM

## 2019-12-03 MED ORDER — SERTRALINE HCL 100 MG PO TABS: 100.0000 mg | ORAL_TABLET | Freq: Every day | ORAL | 3 refills | Status: DC

## 2019-12-16 ENCOUNTER — Other Ambulatory Visit: Payer: Self-pay

## 2019-12-16 NOTE — Patient Instructions (Addendum)
Health Maintenance Due  Topic Date Due  . Hepatitis C Screening  Never done  . COVID-19 Vaccine (1)Yes, both doses. Never done  . HIV Screening  Never done  . PAP SMEAR-Modifier Pt will receive today. Never done    No flowsheet data found.

## 2019-12-17 ENCOUNTER — Ambulatory Visit: Payer: Managed Care, Other (non HMO) | Admitting: Family Medicine

## 2019-12-17 ENCOUNTER — Other Ambulatory Visit (HOSPITAL_COMMUNITY)
Admission: RE | Admit: 2019-12-17 | Discharge: 2019-12-17 | Disposition: A | Discharge status: Home / Self Care | Payer: Managed Care, Other (non HMO) | Source: Ambulatory Visit | Attending: Family Medicine | Admitting: Family Medicine

## 2019-12-17 ENCOUNTER — Encounter: Payer: Self-pay | Admitting: Family Medicine

## 2019-12-17 VITALS — BP 110/72 | HR 83 | Temp 98.6°F | Ht 67.75 in | Wt 132.2 lb

## 2019-12-17 DIAGNOSIS — Z789 Other specified health status: Secondary | ICD-10-CM

## 2019-12-17 DIAGNOSIS — F332 Major depressive disorder, recurrent severe without psychotic features: Secondary | ICD-10-CM | POA: Diagnosis not present

## 2019-12-17 DIAGNOSIS — Z124 Encounter for screening for malignant neoplasm of cervix: Secondary | ICD-10-CM | POA: Diagnosis not present

## 2019-12-17 DIAGNOSIS — F640 Transsexualism: Secondary | ICD-10-CM | POA: Diagnosis not present

## 2019-12-17 DIAGNOSIS — F411 Generalized anxiety disorder: Secondary | ICD-10-CM | POA: Diagnosis not present

## 2019-12-17 DIAGNOSIS — F988 Other specified behavioral and emotional disorders with onset usually occurring in childhood and adolescence: Secondary | ICD-10-CM

## 2019-12-17 DIAGNOSIS — Z79899 Other long term (current) drug therapy: Secondary | ICD-10-CM

## 2019-12-17 MED ORDER — AMPHETAMINE-DEXTROAMPHETAMINE 20 MG PO TABS: 20.0000 mg | ORAL_TABLET | Freq: Every day | ORAL | 0 refills | Status: DC

## 2019-12-17 NOTE — Progress Notes (Signed)
Margaret Marshall is a 24 y.o. adult  Chief Complaint  Patient presents with  . Gynecologic Exam    Medication refill and pap    HPI: [name] is a 24 y.o. adult female here today for routine f/u on anxiety, depression and medication refill. He is on zoloft 100mg  daily and adderall 20mg  daily.   Pt also due for PAP and this result is needed by physician at [facility] who will be performing pts hysterectomy. Surgery date is set for 01/12/20. No abnormal vaginal bleeding. No vaginal itching, odor, discharge.   Past Medical History:  Diagnosis Date  . Anxiety   . Depression   . Mood disorder [facility])     Past Surgical History:  Procedure Laterality Date  . NO PAST SURGERIES      Social History   Socioeconomic History  . Marital status: Single    Spouse name: Not on file  . Number of children: Not on file  . Years of education: Not on file  . Highest education level: Not on file  Occupational History  . Not on file  Tobacco Use  . Smoking status: Never Smoker  . Smokeless tobacco: Never Used  Vaping Use  . Vaping Use: Never used  Substance and Sexual Activity  . Alcohol use: Yes    Comment: occass.   . Drug use: No  . Sexual activity: Not Currently    Birth control/protection: None  Other Topics Concern  . Not on file  Social History Narrative   Living in [place] with mom. Born and raised in [place] by parents. Pt has 4 older siblings. Pt has graduated HS and did one semester of online college. Pt is working part time in retail. Single, no kids.   Denies any legal problems in the past   Social Determinants of Health   Financial Resource Strain:   . Difficulty of Paying Living Expenses:   Food Insecurity:   . Worried About Charity fundraiser in the Last Year:   . Arboriculturist in the Last Year:   Transportation Needs:   . Film/video editor (Medical):   Marland Kitchen Lack of Transportation (Non-Medical):   Physical Activity:   . Days of Exercise per Week:   . Minutes of  Exercise per Session:   Stress:   . Feeling of Stress :   Social Connections:   . Frequency of Communication with Friends and Family:   . Frequency of Social Gatherings with Friends and Family:   . Attends Religious Services:   . Active Member of Clubs or Organizations:   . Attends Archivist Meetings:   Marland Kitchen Marital Status:   Intimate Partner Violence:   . Fear of Current or Ex-Partner:   . Emotionally Abused:   Marland Kitchen Physically Abused:   . Sexually Abused:     Family History  Problem Relation Age of Onset  . Depression Mother   . Meniere's disease Mother   . Depression Sister   . Depression Brother   . Alcohol abuse Maternal Uncle   . Bipolar disorder Father   . ADD / ADHD Father   . Meniere's disease Maternal Grandmother   . Other Neg Hx        transgender     Immunization History  Administered Date(s) Administered  . Influenza,inj,Quad PF,6+ Mos 03/13/2018  . Influenza-Unspecified 04/12/2016, 05/23/2017  . PPD Test 03/13/2018  . Tdap 03/13/2018    Outpatient Encounter Medications as of 12/17/2019  Medication Sig  .  Elagolix Sodium 150 MG TABS Take 150 mg by mouth daily.  . sertraline (ZOLOFT) 100 MG tablet Take 1 tablet (100 mg total) by mouth daily.  Marland Kitchen testosterone cypionate (DEPOTESTOTERONE CYPIONATE) 100 MG/ML injection Inject 0.2 mLs (20 mg total) into the muscle once a week. And syringes 1/week  . [DISCONTINUED] amphetamine-dextroamphetamine (ADDERALL) 20 MG tablet   . amphetamine-dextroamphetamine (ADDERALL) 20 MG tablet Take 1 tablet (20 mg total) by mouth daily.   No facility-administered encounter medications on file as of 12/17/2019.     ROS: Pertinent positives and negatives noted in HPI. Remainder of ROS non-contributory    No Known Allergies  BP 110/72 (BP Location: Left Arm, Patient Position: Sitting, Cuff Size: Normal)   Pulse 83   Temp 98.6 F (37 C) (Temporal)   Ht 5' 7.75" (1.721 m)   Wt 132 lb 3.2 oz (60 kg)   LMP 11/30/2019    SpO2 98%   BMI 20.25 kg/m   Physical Exam Exam conducted with a chaperone present.  Constitutional:      General: He is not in acute distress.    Appearance: Normal appearance. He is normal weight. He is not toxic-appearing.  Cardiovascular:     Rate and Rhythm: Normal rate and regular rhythm.     Pulses: Normal pulses.     Heart sounds: Normal heart sounds.  Pulmonary:     Effort: Pulmonary effort is normal.     Breath sounds: Normal breath sounds.  Genitourinary:    Labia:        Right: No rash, tenderness or lesion.        Left: No rash, tenderness or lesion.      Vagina: Normal. No vaginal discharge, tenderness, bleeding or lesions.     Cervix: No cervical motion tenderness, discharge, friability, erythema or cervical bleeding.     Uterus: Normal.      Adnexa: Right adnexa normal and left adnexa normal.       Right: No mass, tenderness or fullness.         Left: No mass, tenderness or fullness.    Neurological:     Mental Status: He is alert and oriented to person, place, and time.  Psychiatric:        Mood and Affect: Mood normal.        Behavior: Behavior normal.      A/P:  1. Female-to-female transgender person 2. GAD (generalized anxiety disorder) 3. MDD (major depressive disorder), recurrent severe, without psychosis (HCC) - stable, controlled - PHQ-9 = 1, GAD-7 = 3 - cont current med  4. Screening for cervical cancer - Cytology - PAP( [place]) - result to be faxed to [facility] (number given to CMA today)  5. High risk medications (not anticoagulants) long-term use 6. Attention deficit disorder (ADD) without hyperactivity - DRUG MONITORING, PANEL 8 WITH CONFIRMATION, URINE - controlled substance agreement signed today - database reviewed and appropriate Refill: - amphetamine-dextroamphetamine (ADDERALL) 20 MG tablet; Take 1 tablet (20 mg total) by mouth daily.  Dispense: 30 tablet; Refill: 0 - f/u in 3 mo  This visit occurred during the SARS-CoV-2 public  health emergency.  Safety protocols were in place, including screening questions prior to the visit, additional usage of staff PPE, and extensive cleaning of exam room while observing appropriate contact time as indicated for disinfecting solutions.

## 2019-12-19 LAB — CYTOLOGY - PAP
Comment: NEGATIVE
Diagnosis: NEGATIVE
High risk HPV: NEGATIVE

## 2019-12-20 LAB — DRUG MONITORING, PANEL 8 WITH CONFIRMATION, URINE
6 Acetylmorphine: NEGATIVE ng/mL (ref <10)
Alcohol Metabolites: NEGATIVE ng/mL
Amphetamine: 1598 ng/mL — ABNORMAL HIGH (ref <250)
Amphetamines: POSITIVE ng/mL — AB (ref <500)
Benzodiazepines: NEGATIVE ng/mL (ref <100)
Buprenorphine, Urine: NEGATIVE ng/mL (ref <5)
Cocaine Metabolite: NEGATIVE ng/mL (ref <150)
Creatinine: 82.3 mg/dL
MDMA: NEGATIVE ng/mL (ref <500)
Marijuana Metabolite: 394 ng/mL — ABNORMAL HIGH (ref <5)
Marijuana Metabolite: POSITIVE ng/mL — AB (ref <20)
Methamphetamine: NEGATIVE ng/mL (ref <250)
Opiates: NEGATIVE ng/mL (ref <100)
Oxidant: NEGATIVE ug/mL
Oxycodone: NEGATIVE ng/mL (ref <100)
pH: 7.3 (ref 4.5–9.0)

## 2019-12-20 LAB — DM TEMPLATE

## 2020-01-12 ENCOUNTER — Encounter: Payer: Self-pay | Admitting: Endocrinology

## 2020-01-12 ENCOUNTER — Other Ambulatory Visit: Payer: Self-pay | Admitting: Endocrinology

## 2020-01-12 MED ORDER — TESTOSTERONE CYPIONATE 100 MG/ML IM SOLN: 20.0000 mg | INTRAMUSCULAR | 0 refills | Status: DC

## 2020-01-14 ENCOUNTER — Encounter: Payer: Self-pay | Admitting: Family Medicine

## 2020-01-21 ENCOUNTER — Telehealth: Payer: Self-pay

## 2020-01-21 NOTE — Telephone Encounter (Signed)
PRIOR AUTHORIZATION  PA initiation date: 01/21/20  Medication: Testosterone Insurance Company: [facility] Submission completed electronically through [employer] My Meds: Yes  Will await insurance response re: approval/denial.  Margaret Marshall (Key: [id number])  [employer] is reviewing your PA request. Typically an electronic response will be received within 72 hours. To check for an update later, open this request from your dashboard.  You may close this dialog and return to your dashboard to perform other tasks.  Margaret Marshall Key: [id number] - PA Case ID: Vassie Moselle Need help? Call [phone] at [phone] Status Sent to [employer] Drug Testosterone Cypionate 100MG /ML intramuscular solution Form [facility] Electronic Prior Authorization Form (2017 NCPDP)

## 2020-01-22 NOTE — Telephone Encounter (Signed)
APPROVAL  Medication: Testosterone Insurance Company: [facility] [facility] PA response: Approved Approval dates: 01/22/20 through 07/23/20  Documents have been labeled and placed in scan file for HIM and for our future reference.  Youlanda Mighty (Key: [id number])  This request has received a Favorable outcome.  Please note any additional information provided by [facility] at the bottom of your screen.  You will also receive a faxed copy of the determination.  Outcome Approvedtoday Request Reference Number: [id number]. TESTOST CYP INJ 100MG /ML is approved through 07/23/2020. Your patient may now fill this prescription and it will be covered. Drug Testosterone Cypionate 100MG /ML intramuscular solution Form [employer] Electronic Prior Authorization Form (2017 NCPDP)

## 2020-01-23 ENCOUNTER — Emergency Department (HOSPITAL_BASED_OUTPATIENT_CLINIC_OR_DEPARTMENT_OTHER): Payer: Managed Care, Other (non HMO)

## 2020-01-23 ENCOUNTER — Emergency Department (HOSPITAL_BASED_OUTPATIENT_CLINIC_OR_DEPARTMENT_OTHER)
Admission: EM | Admit: 2020-01-23 | Discharge: 2020-01-23 | Disposition: A | Discharge status: Home / Self Care | Payer: Managed Care, Other (non HMO) | Attending: Emergency Medicine | Admitting: Emergency Medicine

## 2020-01-23 ENCOUNTER — Encounter (HOSPITAL_BASED_OUTPATIENT_CLINIC_OR_DEPARTMENT_OTHER): Payer: Self-pay

## 2020-01-23 ENCOUNTER — Other Ambulatory Visit: Payer: Self-pay

## 2020-01-23 DIAGNOSIS — G5721 Lesion of femoral nerve, right lower limb: Secondary | ICD-10-CM | POA: Diagnosis not present

## 2020-01-23 DIAGNOSIS — K5900 Constipation, unspecified: Secondary | ICD-10-CM | POA: Insufficient documentation

## 2020-01-23 DIAGNOSIS — M899 Disorder of bone, unspecified: Secondary | ICD-10-CM

## 2020-01-23 MED ORDER — FLEET ENEMA 7-19 GM/118ML RE ENEM: 1.0000 | ENEMA | Freq: Once | RECTAL | 0 refills | Status: AC

## 2020-01-23 MED ORDER — MAGNESIUM CITRATE PO SOLN: 1.0000 | Freq: Once | ORAL | 0 refills | Status: AC

## 2020-01-23 MED FILL — CITROMA 1.745 GM/30ML SOLN: 1.745 | 1 days supply | Qty: 296 | Fill #0

## 2020-01-23 MED FILL — ENEMA 7-19 GM/118ML ENEM: 7-19 | 1 days supply | Qty: 133 | Fill #0

## 2020-01-23 NOTE — Discharge Instructions (Signed)
If you develop worsening, continued, or recurrent abdominal pain, uncontrolled vomiting, fever, chest or back pain, or any other new/concerning symptoms then return to the ER for evaluation.   Your Xray showed a possible cyst lesion in your right femur. You will need an outpatient MRI of your hip which your primary care physician can set up.

## 2020-01-23 NOTE — ED Triage Notes (Signed)
Pt c/o constipation-last normal BM 7/24-states he had a hysterectomy 7/16-was advised by surgeon over the phone in the last 2 days-used OTC meds without relief-NAD-steady gait

## 2020-01-23 NOTE — ED Provider Notes (Signed)
[facility] EMERGENCY DEPARTMENT Provider Note   CSN: [id number] Arrival date & time: 01/23/20  1129     History Chief Complaint  Patient presents with  . Constipation    Margaret Marshall is a 24 y.o. adult.  HPI Patient presents with constipation.  Last bowel movement was 7/25.  2 weeks ago had hysterectomy.  Seem to have some bowel movements since.  Has not used pain meds since the first couple days.  Is having some gas but feels like needing to go have a bowel movement but cannot.  Some lower abdominal discomfort.  Vomited twice yesterday and small amounts.  No fevers.  Past Medical History:  Diagnosis Date  . Anxiety   . Depression   . Mood disorder [facility])     Patient Active Problem List   Diagnosis Date Noted  . Neck pain 07/29/2018  . Female-to-female transgender person 05/14/2018  . High risk medication use 11/13/2016  . Annual physical exam 04/12/2016  . GAD (generalized anxiety disorder) 08/03/2015  . Gender dysphoria in adult 07/19/2015  . MDD (major depressive disorder), recurrent severe, without psychosis (HCC) 07/18/2015    Past Surgical History:  Procedure Laterality Date  . ABDOMINAL HYSTERECTOMY    . NO PAST SURGERIES       OB History   No obstetric history on file.     Family History  Problem Relation Age of Onset  . Depression Mother   . Meniere's disease Mother   . Depression Sister   . Depression Brother   . Alcohol abuse Maternal Uncle   . Bipolar disorder Father   . ADD / ADHD Father   . Meniere's disease Maternal Grandmother   . Other Neg Hx        transgender    Social History   Tobacco Use  . Smoking status: Never Smoker  . Smokeless tobacco: Never Used  Vaping Use  . Vaping Use: Every day  Substance Use Topics  . Alcohol use: Yes    Comment: occ  . Drug use: Yes    Types: Marijuana    Home Medications Prior to Admission medications   Medication Sig Start Date End Date Taking? Authorizing Provider    acetaminophen (TYLENOL) 325 MG tablet Take 650 mg by mouth every 6 (six) hours as needed for mild pain.   Yes [provider]  amphetamine-dextroamphetamine (ADDERALL) 20 MG tablet Take 1 tablet (20 mg total) by mouth daily. Patient taking differently: Take 20 mg by mouth daily as needed (ADHD).  12/17/19  Yes Cirigliano, Jearld Lesch, DO  Docusate Sodium (DSS) 100 MG CAPS Take 100 mg by mouth 3 (three) times daily as needed for constipation. 01/09/20 02/08/20 Yes [provider]  ibuprofen (ADVIL) 600 MG tablet Take 600 mg by mouth every 6 (six) hours as needed for pain. 01/09/20  Yes [provider]  sertraline (ZOLOFT) 100 MG tablet Take 1 tablet (100 mg total) by mouth daily. 12/03/19  Yes Cirigliano, Mary K, DO  testosterone cypionate (DEPOTESTOTERONE CYPIONATE) 100 MG/ML injection Inject 0.2 mLs (20 mg total) into the muscle once a week. And syringes 1/week Patient taking differently: Inject 30 mg into the muscle once a week. And syringes 1/week 01/12/20  Yes Romero Belling, MD  magnesium citrate SOLN Take 296 mLs (1 Bottle total) by mouth once for 1 dose. 01/23/20 01/23/20  Pricilla Loveless, MD  sodium phosphate (FLEET) 7-19 GM/118ML ENEM Place 133 mLs (1 enema total) rectally once for 1 dose. 01/23/20 01/23/20  Pricilla Loveless, MD    Allergies    Patient has no known allergies.  Review of Systems   Review of Systems  Constitutional: Negative for fever.  Gastrointestinal: Positive for abdominal pain, constipation and vomiting.  All other systems reviewed and are negative.   Physical Exam Updated Vital Signs BP 112/65 (BP Location: Left Arm)   Pulse 85   Temp 99.1 F (37.3 C) (Oral)   Resp 16   Ht 5\' 7"  (1.702 m)   Wt 60.5 kg   LMP 11/30/2019   SpO2 100%   BMI 20.89 kg/m   Physical Exam Vitals and nursing note reviewed. Exam conducted with a chaperone present.  Constitutional:      Appearance: He is well-developed.  HENT:     Head: Normocephalic and  atraumatic.     Right Ear: External ear normal.     Left Ear: External ear normal.     Nose: Nose normal.  Eyes:     General:        Right eye: No discharge.        Left eye: No discharge.  Cardiovascular:     Rate and Rhythm: Normal rate and regular rhythm.     Heart sounds: Normal heart sounds.  Pulmonary:     Effort: Pulmonary effort is normal.     Breath sounds: Normal breath sounds.  Abdominal:     General: There is no distension.     Palpations: Abdomen is soft.     Tenderness: There is abdominal tenderness (nonfocal mild lower abdominal tenderness).  Genitourinary:    Rectum: No mass, tenderness, anal fissure, external hemorrhoid or internal hemorrhoid. Normal anal tone.     Comments: No fecal impaction Skin:    General: Skin is warm and dry.  Neurological:     Mental Status: He is alert.  Psychiatric:        Mood and Affect: Mood is not anxious.     ED Results / Procedures / Treatments   Labs (all labs ordered are listed, but only abnormal results are displayed) Labs Reviewed - No data to display  EKG None  Radiology DG Abdomen 1 View  Result Date: 01/23/2020 CLINICAL DATA:  Constipation. EXAM: ABDOMEN - 1 VIEW COMPARISON:  No prior. FINDINGS: Moderate stool volume noted in the left colon and rectum. No bowel distention noted. No free air. Soft tissues are unremarkable. Corticated cystic focus noted the proximal right femur, most likely a benign bone lesion. Right hip MRI can be obtained for confirmation. No acute bony abnormality. IMPRESSION: 1. Moderate stool volume noted the left colon and rectum. No bowel distention. 2. Corticated cystic focus in the proximal right femur, most likely benign bone lesion. Right hip MRI can be obtained for confirmation. Electronically Signed   By: 01/25/2020  Register   On: 01/23/2020 12:51    Procedures Procedures (including critical care time)  Medications Ordered in ED Medications - No data to display  ED Course  I have  reviewed the triage vital signs and the nursing notes.  Pertinent labs & imaging results that were available during my care of the patient were reviewed by me and considered in my medical decision making (see chart for details).    MDM Rules/Calculators/A&P                          Vital signs are normal.  Very minimal nonfocal abdominal tenderness on exam.  An x-ray was performed in triage  which I have personally reviewed.  There is some stool seen though no obvious obstruction.  While there was some vomiting earlier, this seems unlikely to be acute bowel obstruction.  Likely postop constipation.  Has been trying some MiraLAX and Colace, and I have discussed treatment options here.  There is no fecal impaction.  Discussed enema here versus at home and patient prefers to do this at home.  Will also prescribe magnesium citrate.  Otherwise, follow-up with surgeon.  Patient was also notified of the cystic focus in the femur, needing an outpatient MRI.  No complaints of pain in this area. Final Clinical Impression(s) / ED Diagnoses Final diagnoses:  Constipation, unspecified constipation type  Lesion of right femur    Rx / DC Orders ED Discharge Orders         Ordered    sodium phosphate (FLEET) 7-19 GM/118ML ENEM   Once     Discontinue  Reprint     01/23/20 1620    magnesium citrate SOLN   Once     Discontinue  Reprint     01/23/20 1620           Pricilla Loveless, MD 01/23/20 1627

## 2020-01-23 NOTE — ED Notes (Signed)
AVS reviewed with client along with Rx by EDP as well, pt teaching done regards to changing diet to enhance bowel movements, also increasing water to aid in moving bowels, opportunity for questions provided

## 2020-01-28 ENCOUNTER — Other Ambulatory Visit: Payer: Self-pay

## 2020-01-29 ENCOUNTER — Ambulatory Visit (INDEPENDENT_AMBULATORY_CARE_PROVIDER_SITE_OTHER): Payer: Managed Care, Other (non HMO) | Admitting: Family Medicine

## 2020-01-29 ENCOUNTER — Telehealth: Payer: Self-pay

## 2020-01-29 ENCOUNTER — Encounter: Payer: Self-pay | Admitting: Family Medicine

## 2020-01-29 VITALS — BP 120/70 | HR 84 | Temp 98.3°F | Resp 18 | Wt 131.0 lb

## 2020-01-29 DIAGNOSIS — K5900 Constipation, unspecified: Secondary | ICD-10-CM | POA: Diagnosis not present

## 2020-01-29 DIAGNOSIS — M899 Disorder of bone, unspecified: Secondary | ICD-10-CM

## 2020-01-29 MED ORDER — DOCUSATE SODIUM 100 MG PO CAPS: 100.0000 mg | ORAL_CAPSULE | Freq: Two times a day (BID) | ORAL | 1 refills | Status: DC

## 2020-01-29 NOTE — Progress Notes (Signed)
Margaret Marshall is a 24 y.o. adult  Chief Complaint  Patient presents with  . Constipation    x2 weeks, but states it is slowly improving    HPI: Margaret Marshall is a 24 y.o. adult who complains of 2 wk h/o constipation. Pt going days without BM or very little/scant BM. No blood in stool. Pt is s/p TAH on 01/09/20.   Pt went to [facility] for constipation on 01/23/20. Xray done and sowed moderate stool volume in LT colon.  Incidental finding of cystic lesion in Rt femur. Outpt MRI recommended.    Past Medical History:  Diagnosis Date  . Anxiety   . Depression   . Mood disorder [facility])     Past Surgical History:  Procedure Laterality Date  . ABDOMINAL HYSTERECTOMY    . NO PAST SURGERIES      Social History   Socioeconomic History  . Marital status: Single    Spouse name: Not on file  . Number of children: Not on file  . Years of education: Not on file  . Highest education level: Not on file  Occupational History  . Not on file  Tobacco Use  . Smoking status: Current Every Day Smoker    Types: E-cigarettes  . Smokeless tobacco: Never Used  Vaping Use  . Vaping Use: Every day  Substance and Sexual Activity  . Alcohol use: Yes    Comment: occ  . Drug use: Yes    Types: Marijuana  . Sexual activity: Not on file  Other Topics Concern  . Not on file  Social History Narrative   Living in [place] with mom. Born and raised in [place] by parents. Pt has 4 older siblings. Pt has graduated HS and did one semester of online college. Pt is working part time in retail. Single, no kids.   Denies any legal problems in the past   Social Determinants of Health   Financial Resource Strain:   . Difficulty of Paying Living Expenses:   Food Insecurity:   . Worried About Programme researcher, broadcasting/film/video in the Last Year:   . Barista in the Last Year:   Transportation Needs:   . Freight forwarder (Medical):   Marland Kitchen Lack of Transportation (Non-Medical):   Physical Activity:   . Days  of Exercise per Week:   . Minutes of Exercise per Session:   Stress:   . Feeling of Stress :   Social Connections:   . Frequency of Communication with Friends and Family:   . Frequency of Social Gatherings with Friends and Family:   . Attends Religious Services:   . Active Member of Clubs or Organizations:   . Attends Banker Meetings:   Marland Kitchen Marital Status:   Intimate Partner Violence:   . Fear of Current or Ex-Partner:   . Emotionally Abused:   Marland Kitchen Physically Abused:   . Sexually Abused:     Family History  Problem Relation Age of Onset  . Depression Mother   . Meniere's disease Mother   . Depression Sister   . Depression Brother   . Alcohol abuse Maternal Uncle   . Bipolar disorder Father   . ADD / ADHD Father   . Meniere's disease Maternal Grandmother   . Other Neg Hx        transgender     Immunization History  Administered Date(s) Administered  . Influenza,inj,Quad PF,6+ Mos 03/13/2018  . Influenza-Unspecified 04/12/2016, 05/23/2017  . PPD Test 03/13/2018  .  Tdap 03/13/2018    Outpatient Encounter Medications as of 01/29/2020  Medication Sig Note  . amphetamine-dextroamphetamine (ADDERALL) 20 MG tablet Take 1 tablet (20 mg total) by mouth daily. (Patient taking differently: Take 20 mg by mouth daily as needed (ADHD). )   . Docusate Sodium (DSS) 100 MG CAPS Take 100 mg by mouth 3 (three) times daily as needed for constipation. 01/29/2020: Pt states he takes it but it does not help.   . sertraline (ZOLOFT) 100 MG tablet Take 1 tablet (100 mg total) by mouth daily.   Marland Kitchen testosterone cypionate (DEPOTESTOTERONE CYPIONATE) 100 MG/ML injection Inject 0.2 mLs (20 mg total) into the muscle once a week. And syringes 1/week (Patient taking differently: Inject 30 mg into the muscle once a week. And syringes 1/week) 01/23/2020: Sundays  . [DISCONTINUED] acetaminophen (TYLENOL) 325 MG tablet Take 650 mg by mouth every 6 (six) hours as needed for mild pain.   . [DISCONTINUED]  ibuprofen (ADVIL) 600 MG tablet Take 600 mg by mouth every 6 (six) hours as needed for pain.    No facility-administered encounter medications on file as of 01/29/2020.     ROS: Pertinent positives and negatives noted in HPI. Remainder of ROS non-contributory    No Known Allergies  BP 120/70 (BP Location: Left Arm, Patient Position: Sitting, Cuff Size: Normal)   Pulse 84   Temp 98.3 F (36.8 C) (Temporal)   Resp 18   Wt 131 lb (59.4 kg)   LMP 11/30/2019   SpO2 98%   BMI 20.52 kg/m   Physical Exam Constitutional:      Appearance: Normal appearance. He is not ill-appearing.  Abdominal:     General: A surgical scar is present. Bowel sounds are decreased. There is no distension.     Tenderness: There is abdominal tenderness in the left lower quadrant.  Neurological:     Mental Status: He is alert and oriented to person, place, and time.  Psychiatric:        Behavior: Behavior normal.      EXAM: ABDOMEN - 1 VIEW  FINDINGS: Moderate stool volume noted in the left colon and rectum. No bowel distention noted. No free air. Soft tissues are unremarkable. Corticated cystic focus noted the proximal right femur, most likely a benign bone lesion. Right hip MRI can be obtained for confirmation. No acute bony abnormality.  IMPRESSION: 1. Moderate stool volume noted the left colon and rectum. No bowel distention.  2. Corticated cystic focus in the proximal right femur, most likely benign bone lesion. Right hip MRI can be obtained for confirmation.   Electronically Signed   By: [name] Fus  Register   On: 01/23/2020 12:51    A/P:  1. Constipation, unspecified constipation type - needs clean out so recommend miralax BID with colace BID. If no BM in 2-3 days, 1-2 doses of laxative like dulcolax. If still no BM in 1 day, try enema Refill: - docusate sodium (COLACE) 100 MG capsule; Take 1 capsule (100 mg total) by mouth 2 (two) times daily.  Dispense: 60 capsule; Refill:  1 - cont with increased water intake - once cleaned out, resume regular use of daily miralax  2. Lesion of left femur - MR HIP RIGHT W CONTRAST; Future   This visit occurred during the SARS-CoV-2 public health emergency.  Safety protocols were in place, including screening questions prior to the visit, additional usage of staff PPE, and extensive cleaning of exam room while observing appropriate contact time as indicated for disinfecting solutions.

## 2020-01-29 NOTE — Telephone Encounter (Signed)
Order updated

## 2020-01-29 NOTE — Telephone Encounter (Signed)
Whitley from [facility] called and informed me that the order that was put in for the MRI has to be changed to With/without contrast.  After it is put in like that, they can schedule the patient.  Please advise.

## 2020-01-29 NOTE — Patient Instructions (Addendum)
Take miralax 1 capful in 6oz water twice per day Take stool softener colace 100mg  twice per day If you are not having large volume BM after 2 days of this, take 1-2 doses of laxative (dulcolax) If no BM 1 day after laxative, try enema  For regular use: miralax 1 capful in 6oz of water daily

## 2020-02-17 ENCOUNTER — Telehealth: Payer: Self-pay | Admitting: Family Medicine

## 2020-02-17 DIAGNOSIS — F332 Major depressive disorder, recurrent severe without psychotic features: Secondary | ICD-10-CM

## 2020-02-17 DIAGNOSIS — F411 Generalized anxiety disorder: Secondary | ICD-10-CM

## 2020-02-17 NOTE — Telephone Encounter (Signed)
Spoke with pharmacy and they confirmed patient has Rx on file and it is ready for him to pick up. Left detailed voicemail informing patient of this.

## 2020-02-17 NOTE — Telephone Encounter (Signed)
Patient is calling and requesting a refill for sertraline sent to [facility] in [place] on [address], please advise. CB is [phone]

## 2020-02-18 NOTE — Telephone Encounter (Signed)
Patient called after hours line requesting that her prescriptions be sent to [facility] (ph # [phone]). She said that she is having trouble getting her prescriptions filled. Please call her at [phone] if you have any questions.

## 2020-02-19 MED ORDER — SERTRALINE HCL 100 MG PO TABS: 100.0000 mg | ORAL_TABLET | Freq: Every day | ORAL | 3 refills | Status: DC

## 2020-02-19 NOTE — Telephone Encounter (Signed)
Spoke to patient and advise that Rx was sent to the pharmacy requested.

## 2020-02-19 NOTE — Telephone Encounter (Signed)
Dr. Salena Saner please advise.  Pt is wanting to switch pharmacy to [facility] pharmacy phone number  [phone]

## 2020-02-19 NOTE — Telephone Encounter (Signed)
Sertraline sent to [facility] on [address] as requested

## 2020-02-19 NOTE — Addendum Note (Signed)
Addended by: Overton Mam on: 02/19/2020 12:20 PM   Modules accepted: Orders

## 2020-02-26 ENCOUNTER — Encounter: Payer: Self-pay | Admitting: Family Medicine

## 2020-02-26 ENCOUNTER — Other Ambulatory Visit: Payer: Self-pay | Admitting: Family Medicine

## 2020-02-26 DIAGNOSIS — M899 Disorder of bone, unspecified: Secondary | ICD-10-CM

## 2020-03-24 ENCOUNTER — Encounter: Payer: Self-pay | Admitting: Family Medicine

## 2020-03-24 ENCOUNTER — Ambulatory Visit (INDEPENDENT_AMBULATORY_CARE_PROVIDER_SITE_OTHER): Payer: Managed Care, Other (non HMO) | Admitting: Family Medicine

## 2020-03-24 ENCOUNTER — Other Ambulatory Visit (HOSPITAL_COMMUNITY)
Admission: RE | Admit: 2020-03-24 | Discharge: 2020-03-24 | Disposition: A | Discharge status: Home / Self Care | Payer: Managed Care, Other (non HMO) | Source: Ambulatory Visit | Attending: Family Medicine | Admitting: Family Medicine

## 2020-03-24 ENCOUNTER — Ambulatory Visit: Payer: Managed Care, Other (non HMO) | Admitting: Family Medicine

## 2020-03-24 ENCOUNTER — Ambulatory Visit (INDEPENDENT_AMBULATORY_CARE_PROVIDER_SITE_OTHER): Payer: Managed Care, Other (non HMO)

## 2020-03-24 VITALS — BP 124/70 | HR 93 | Temp 97.3°F | Ht 67.0 in | Wt 135.0 lb

## 2020-03-24 DIAGNOSIS — Z23 Encounter for immunization: Secondary | ICD-10-CM | POA: Diagnosis not present

## 2020-03-24 DIAGNOSIS — F332 Major depressive disorder, recurrent severe without psychotic features: Secondary | ICD-10-CM | POA: Diagnosis not present

## 2020-03-24 DIAGNOSIS — Z79899 Other long term (current) drug therapy: Secondary | ICD-10-CM

## 2020-03-24 DIAGNOSIS — F988 Other specified behavioral and emotional disorders with onset usually occurring in childhood and adolescence: Secondary | ICD-10-CM | POA: Diagnosis not present

## 2020-03-24 DIAGNOSIS — Z202 Contact with and (suspected) exposure to infections with a predominantly sexual mode of transmission: Secondary | ICD-10-CM

## 2020-03-24 DIAGNOSIS — M899 Disorder of bone, unspecified: Secondary | ICD-10-CM | POA: Diagnosis not present

## 2020-03-24 DIAGNOSIS — F411 Generalized anxiety disorder: Secondary | ICD-10-CM

## 2020-03-24 MED ORDER — AMPHETAMINE-DEXTROAMPHETAMINE 20 MG PO TABS: 20.0000 mg | ORAL_TABLET | Freq: Every day | ORAL | 0 refills | Status: DC

## 2020-03-24 NOTE — Progress Notes (Signed)
Margaret Marshall is a 24 y.o. adult  Chief Complaint  Patient presents with  . Follow-up    ADD f/u, meds. flu shot today.    HPI: Margaret Marshall is a 24 y.o. adult seen today for routine f/u on chronic medical issues and medication refills.  Pt is on Adderall 20mg  daily for ADD. Symptoms well controlled. Pt does not take on weekends. No side effects. Pt is on zoloft 100mg  daily for GAD and depression and feels this is effective.   Pt is also requesting STI testing.  symptoms - no Known exposure - sexual partner from 3-4 wks ago was diagnosed with and being treated for trichomonas.   Pt would like flu vaccine today.   Pt needs to have xray done of Rt hip and femur to f/u on abnormality seen incidentally on abd .  Past Medical History:  Diagnosis Date  . Anxiety   . Depression   . Mood disorder [facility])     Past Surgical History:  Procedure Laterality Date  . ABDOMINAL HYSTERECTOMY    . NO PAST SURGERIES      Social History   Socioeconomic History  . Marital status: Single    Spouse name: Not on file  . Number of children: Not on file  . Years of education: Not on file  . Highest education level: Not on file  Occupational History  . Not on file  Tobacco Use  . Smoking status: Current Every Day Smoker    Types: E-cigarettes  . Smokeless tobacco: Never Used  Vaping Use  . Vaping Use: Every day  Substance and Sexual Activity  . Alcohol use: Yes    Comment: occ  . Drug use: Yes    Types: Marijuana  . Sexual activity: Not on file  Other Topics Concern  . Not on file  Social History Narrative   Living in [place] with mom. Born and raised in [place] by parents. Pt has 4 older siblings. Pt has graduated HS and did one semester of online college. Pt is working part time in retail. Single, no kids.   Denies any legal problems in the past   Social Determinants of Health   Financial Resource Strain:   . Difficulty of Paying Living Expenses: Not on file  Food Insecurity:    . Worried About [place] in the Last Year: Not on file  . Ran Out of Food in the Last Year: Not on file  Transportation Needs:   . Lack of Transportation (Medical): Not on file  . Lack of Transportation (Non-Medical): Not on file  Physical Activity:   . Days of Exercise per Week: Not on file  . Minutes of Exercise per Session: Not on file  Stress:   . Feeling of Stress : Not on file  Social Connections:   . Frequency of Communication with Friends and Family: Not on file  . Frequency of Social Gatherings with Friends and Family: Not on file  . Attends Religious Services: Not on file  . Active Member of Clubs or Organizations: Not on file  . Attends [place] Meetings: Not on file  . Marital Status: Not on file  Intimate Partner Violence:   . Fear of Current or Ex-Partner: Not on file  . Emotionally Abused: Not on file  . Physically Abused: Not on file  . Sexually Abused: Not on file    Family History  Problem Relation Age of Onset  . Depression Mother   .  Meniere's disease Mother   . Depression Sister   . Depression Brother   . Alcohol abuse Maternal Uncle   . Bipolar disorder Father   . ADD / ADHD Father   . Meniere's disease Maternal Grandmother   . Other Neg Hx        transgender     Immunization History  Administered Date(s) Administered  . Influenza,inj,Quad PF,6+ Mos 03/13/2018  . Influenza-Unspecified 04/12/2016, 05/23/2017  . PFIZER SARS-COV-2 Vaccination 09/11/2019, 10/02/2019  . PPD Test 03/13/2018  . Tdap 03/13/2018    Outpatient Encounter Medications as of 03/24/2020  Medication Sig Note  . amphetamine-dextroamphetamine (ADDERALL) 20 MG tablet Take 1 tablet (20 mg total) by mouth daily.   . sertraline (ZOLOFT) 100 MG tablet Take 1 tablet (100 mg total) by mouth daily.   Marland Kitchen testosterone cypionate (DEPOTESTOTERONE CYPIONATE) 100 MG/ML injection Inject 0.2 mLs (20 mg total) into the muscle once a week. And syringes 1/week (Patient  taking differently: Inject 30 mg into the muscle once a week. And syringes 1/week) 01/23/2020: Sundays  . [DISCONTINUED] amphetamine-dextroamphetamine (ADDERALL) 20 MG tablet Take 1 tablet (20 mg total) by mouth daily. (Patient taking differently: Take 20 mg by mouth daily as needed (ADHD). )   . [DISCONTINUED] docusate sodium (COLACE) 100 MG capsule Take 1 capsule (100 mg total) by mouth 2 (two) times daily.    No facility-administered encounter medications on file as of 03/24/2020.     ROS: Pertinent positives and negatives noted in HPI. Remainder of ROS non-contributory    No Known Allergies  BP 124/70   Pulse 93   Temp (!) 97.3 F (36.3 C) (Temporal)   Ht 5\' 7"  (1.702 m)   Wt 135 lb (61.2 kg)   LMP 11/30/2019   SpO2 98%   BMI 21.14 kg/m    BP Readings from Last 3 Encounters:  03/24/20 124/70  01/29/20 120/70  01/23/20 112/65   Pulse Readings from Last 3 Encounters:  03/24/20 93  01/29/20 84  01/23/20 85     Physical Exam Constitutional:      General: He is not in acute distress.    Appearance: Normal appearance. He is not ill-appearing.  Cardiovascular:     Rate and Rhythm: Normal rate and regular rhythm.     Pulses: Normal pulses.     Heart sounds: Normal heart sounds.  Pulmonary:     Effort: Pulmonary effort is normal. No respiratory distress.     Breath sounds: Normal breath sounds. No wheezing or rhonchi.  Neurological:     Mental Status: He is alert and oriented to person, place, and time.  Psychiatric:        Mood and Affect: Mood normal.        Behavior: Behavior normal.      A/P:  1. GAD (generalized anxiety disorder) 2. MDD (major depressive disorder), recurrent severe, without psychosis (HCC) - PHQ-9 = 2, GAD-7 = 2 - controlled - cont sertraline 100mg  daily  3. Attention deficit disorder (ADD) without hyperactivity - stable, controlled - database reviewed and appropriate - UDS UTD - controlled substance agreement signed today Refill: -  amphetamine-dextroamphetamine (ADDERALL) 20 MG tablet; Take 1 tablet (20 mg total) by mouth daily.  Dispense: 30 tablet; Refill: 0  4. Exposure to sexually transmitted disease (STD) - Hepatitis B surface antigen - HIV Antibody (routine testing w rflx) - RPR - Hepatitis C Antibody - Urine cytology ancillary only([facility])  6. Need for influenza vaccination - Flu Vaccine QUAD 6+ mos  PF IM (Fluarix Quad PF)

## 2020-03-25 LAB — HEPATITIS C ANTIBODY: Hep C Virus Ab: 0.1 s/co ratio (ref 0.0–0.9)

## 2020-03-25 LAB — HIV ANTIBODY (ROUTINE TESTING W REFLEX): HIV Screen 4th Generation wRfx: NONREACTIVE

## 2020-03-25 LAB — RPR: RPR Ser Ql: NONREACTIVE

## 2020-03-25 LAB — HEPATITIS B SURFACE ANTIGEN: Hepatitis B Surface Ag: NEGATIVE

## 2020-03-26 ENCOUNTER — Encounter: Payer: Self-pay | Admitting: Family Medicine

## 2020-03-26 DIAGNOSIS — A749 Chlamydial infection, unspecified: Secondary | ICD-10-CM

## 2020-03-26 DIAGNOSIS — A5901 Trichomonal vulvovaginitis: Secondary | ICD-10-CM

## 2020-03-26 LAB — URINE CYTOLOGY ANCILLARY ONLY
Chlamydia: POSITIVE — AB
Comment: NEGATIVE
Comment: NEGATIVE
Comment: NORMAL
Neisseria Gonorrhea: NEGATIVE
Trichomonas: POSITIVE — AB

## 2020-03-29 MED ORDER — AZITHROMYCIN 500 MG PO TABS: 1000.0000 mg | ORAL_TABLET | Freq: Every day | ORAL | 0 refills | Status: DC

## 2020-03-29 MED ORDER — METRONIDAZOLE 500 MG PO TABS: 2000.0000 mg | ORAL_TABLET | Freq: Once | ORAL | 0 refills | Status: AC

## 2020-03-30 ENCOUNTER — Encounter: Payer: Self-pay | Admitting: Family Medicine

## 2020-03-30 ENCOUNTER — Other Ambulatory Visit: Payer: Self-pay | Admitting: Family Medicine

## 2020-04-09 ENCOUNTER — Ambulatory Visit (INDEPENDENT_AMBULATORY_CARE_PROVIDER_SITE_OTHER): Payer: Managed Care, Other (non HMO) | Admitting: Family Medicine

## 2020-04-09 ENCOUNTER — Other Ambulatory Visit: Payer: Self-pay

## 2020-04-09 ENCOUNTER — Other Ambulatory Visit (HOSPITAL_COMMUNITY)
Admission: RE | Admit: 2020-04-09 | Discharge: 2020-04-09 | Disposition: A | Discharge status: Home / Self Care | Payer: Managed Care, Other (non HMO) | Source: Ambulatory Visit | Attending: Family Medicine | Admitting: Family Medicine

## 2020-04-09 ENCOUNTER — Encounter: Payer: Self-pay | Admitting: Family Medicine

## 2020-04-09 VITALS — BP 120/70 | HR 102 | Temp 98.7°F | Ht 67.0 in | Wt 137.6 lb

## 2020-04-09 DIAGNOSIS — A749 Chlamydial infection, unspecified: Secondary | ICD-10-CM | POA: Diagnosis present

## 2020-04-09 DIAGNOSIS — A599 Trichomoniasis, unspecified: Secondary | ICD-10-CM | POA: Diagnosis present

## 2020-04-09 DIAGNOSIS — Z113 Encounter for screening for infections with a predominantly sexual mode of transmission: Secondary | ICD-10-CM | POA: Diagnosis present

## 2020-04-09 DIAGNOSIS — Z8619 Personal history of other infectious and parasitic diseases: Secondary | ICD-10-CM

## 2020-04-09 NOTE — Progress Notes (Signed)
Margaret Marshall is a 24 y.o. adult  Chief Complaint  Patient presents with  . Follow-up    wants to repeat STD testing    Margaret Marshall is a 24 y.o. adult who tested positive for chlamydia and trichomonas on 03/24/20. He was treated with azithromycin and metronidazole and was recommended to RTO for repeat testing.  He denies any symptoms. Pt did notify sexual partners so they could be tested/treated. Pt has not been sexually active since treatment.   Past Medical History:  Diagnosis Date  . Anxiety   . Depression   . Mood disorder [facility])     Past Surgical History:  Procedure Laterality Date  . ABDOMINAL HYSTERECTOMY    . NO PAST SURGERIES      Social History   Socioeconomic History  . Marital status: Single    Spouse name: Not on file  . Number of children: Not on file  . Years of education: Not on file  . Highest education level: Not on file  Occupational History  . Not on file  Tobacco Use  . Smoking status: Current Every Day Smoker    Types: E-cigarettes  . Smokeless tobacco: Never Used  Vaping Use  . Vaping Use: Every day  Substance and Sexual Activity  . Alcohol use: Yes    Comment: occ  . Drug use: Yes    Types: Marijuana  . Sexual activity: Not on file  Other Topics Concern  . Not on file  Social History Narrative   Living in [place] with mom. Born and raised in [place] by parents. Pt has 4 older siblings. Pt has graduated HS and did one semester of online college. Pt is working part time in retail. Single, no kids.   Denies any legal problems in the past   Social Determinants of Health   Financial Resource Strain:   . Difficulty of Paying Living Expenses: Not on file  Food Insecurity:   . Worried About Programme researcher, broadcasting/film/video in the Last Year: Not on file  . Ran Out of Food in the Last Year: Not on file  Transportation Needs:   . Lack of Transportation (Medical): Not on file  . Lack of Transportation (Non-Medical): Not on file  Physical Activity:     . Days of Exercise per Week: Not on file  . Minutes of Exercise per Session: Not on file  Stress:   . Feeling of Stress : Not on file  Social Connections:   . Frequency of Communication with Friends and Family: Not on file  . Frequency of Social Gatherings with Friends and Family: Not on file  . Attends Religious Services: Not on file  . Active Member of Clubs or Organizations: Not on file  . Attends Banker Meetings: Not on file  . Marital Status: Not on file  Intimate Partner Violence:   . Fear of Current or Ex-Partner: Not on file  . Emotionally Abused: Not on file  . Physically Abused: Not on file  . Sexually Abused: Not on file    Family History  Problem Relation Age of Onset  . Depression Mother   . Meniere's disease Mother   . Depression Sister   . Depression Brother   . Alcohol abuse Maternal Uncle   . Bipolar disorder Father   . ADD / ADHD Father   . Meniere's disease Maternal Grandmother   . Other Neg Hx        transgender     Immunization History  Administered Date(s) Administered  . Influenza,inj,Quad PF,6+ Mos 03/13/2018, 03/24/2020  . Influenza-Unspecified 04/12/2016, 05/23/2017  . PFIZER SARS-COV-2 Vaccination 09/11/2019, 10/02/2019  . PPD Test 03/13/2018  . Tdap 03/13/2018    Outpatient Encounter Medications as of 04/09/2020  Medication Sig Note  . amphetamine-dextroamphetamine (ADDERALL) 20 MG tablet Take 1 tablet (20 mg total) by mouth daily.   . sertraline (ZOLOFT) 100 MG tablet Take 1 tablet (100 mg total) by mouth daily.   Marland Kitchen testosterone cypionate (DEPOTESTOTERONE CYPIONATE) 100 MG/ML injection Inject 0.2 mLs (20 mg total) into the muscle once a week. And syringes 1/week (Patient taking differently: Inject 30 mg into the muscle once a week. And syringes 1/week) 01/23/2020: Sundays  . [DISCONTINUED] azithromycin (ZITHROMAX) 500 MG tablet Take 2 tablets (1,000 mg total) by mouth daily. (Patient not taking: Reported on 04/09/2020)    No  facility-administered encounter medications on file as of 04/09/2020.     ROS: Pertinent positives and negatives noted in HPI. Remainder of ROS non-contributory   No Known Allergies  BP 120/70   Pulse (!) 102   Temp 98.7 F (37.1 C) (Temporal)   Ht 5\' 7"  (1.702 m)   Wt 137 lb 9.6 oz (62.4 kg)   LMP 11/30/2019   SpO2 98%   BMI 21.55 kg/m   Physical Exam Constitutional:      General: He is not in acute distress.    Appearance: Normal appearance. He is not ill-appearing.  Pulmonary:     Effort: No respiratory distress.  Neurological:     Mental Status: He is alert and oriented to person, place, and time.  Psychiatric:        Mood and Affect: Mood normal.        Behavior: Behavior normal.     A/P:  1. H/O chlamydia infection 2. H/O trichomoniasis 3. Screening for STD (sexually transmitted disease) - tested positive and subsequently treated for chlamydia and trichomonas - partners notified - tested/treated - Urine cytology ancillary only([place]) - all questions answered - will contact pt with results when available  This visit occurred during the SARS-CoV-2 public health emergency.  Safety protocols were in place, including screening questions prior to the visit, additional usage of staff PPE, and extensive cleaning of exam room while observing appropriate contact time as indicated for disinfecting solutions.

## 2020-04-13 LAB — URINE CYTOLOGY ANCILLARY ONLY
Chlamydia: NEGATIVE
Comment: NEGATIVE
Comment: NEGATIVE
Comment: NORMAL
Neisseria Gonorrhea: NEGATIVE
Trichomonas: NEGATIVE

## 2020-04-20 ENCOUNTER — Encounter: Payer: Self-pay | Admitting: Family Medicine

## 2020-05-12 ENCOUNTER — Other Ambulatory Visit: Payer: Self-pay

## 2020-05-12 ENCOUNTER — Ambulatory Visit (INDEPENDENT_AMBULATORY_CARE_PROVIDER_SITE_OTHER): Payer: Managed Care, Other (non HMO) | Admitting: Endocrinology

## 2020-05-12 ENCOUNTER — Encounter: Payer: Self-pay | Admitting: Endocrinology

## 2020-05-12 VITALS — BP 118/74 | HR 84 | Ht 67.0 in | Wt 138.0 lb

## 2020-05-12 DIAGNOSIS — Z789 Other specified health status: Secondary | ICD-10-CM

## 2020-05-12 LAB — CBC WITH DIFFERENTIAL/PLATELET
Basophils Absolute: 0 10*3/uL (ref 0.0–0.1)
Basophils Relative: 0.7 % (ref 0.0–3.0)
Eosinophils Absolute: 0.1 10*3/uL (ref 0.0–0.7)
Eosinophils Relative: 1.5 % (ref 0.0–5.0)
HCT: 40.7 % (ref 36.0–46.0)
Hemoglobin: 13.8 g/dL (ref 12.0–15.0)
Lymphocytes Relative: 34.5 % (ref 12.0–46.0)
Lymphs Abs: 1.9 10*3/uL (ref 0.7–4.0)
MCHC: 34 g/dL (ref 30.0–36.0)
MCV: 84.3 fl (ref 78.0–100.0)
Monocytes Absolute: 0.3 10*3/uL (ref 0.1–1.0)
Monocytes Relative: 6.3 % (ref 3.0–12.0)
Neutro Abs: 3.1 10*3/uL (ref 1.4–7.7)
Neutrophils Relative %: 57 % (ref 43.0–77.0)
Platelets: 283 10*3/uL (ref 150.0–400.0)
RBC: 4.82 Mil/uL (ref 3.87–5.11)
RDW: 13.2 % (ref 11.5–15.5)
WBC: 5.4 10*3/uL (ref 4.0–10.5)

## 2020-05-12 LAB — TSH: TSH: 0.35 u[IU]/mL (ref 0.35–4.50)

## 2020-05-12 LAB — T4, FREE: Free T4: 0.7 ng/dL (ref 0.60–1.60)

## 2020-05-12 NOTE — Patient Instructions (Signed)
Blood tests are requested for you today.  We'll let you know about the results.  Please come back for a follow-up appointment in 6 months.   

## 2020-05-12 NOTE — Progress Notes (Signed)
Subjective:    Patient ID: Margaret Marshall, adult    DOB: 04-30-1996, 24 y.o.   MRN: [id number]  HPI Pt returns for f/u of transgender state (F to M): Surgery: TAH (2021, at [facility]) Medication: testosterone treatmtent since 2017.     Counseling: sees [facility] SDOH: ins declined elagolix Other: dosage has been limited by polycythemia; he gets free labs at [facility].   Interval hx: he takes meds as rx'ed.  pt states he feels well in general.  Last testosterone dose was 2 days ago.  Since testosterone was increased, he does not notice any change.  He says breast and clitoral changes have been minimal.   He was also noted to have slight low TSH in 2020 (he has never had thyroid imaging; recheck was normal).   Past Medical History:  Diagnosis Date  . Anxiety   . Depression   . Mood disorder [facility])     Past Surgical History:  Procedure Laterality Date  . ABDOMINAL HYSTERECTOMY    . NO PAST SURGERIES      Social History   Socioeconomic History  . Marital status: Single    Spouse name: Not on file  . Number of children: Not on file  . Years of education: Not on file  . Highest education level: Not on file  Occupational History  . Not on file  Tobacco Use  . Smoking status: Current Every Day Smoker    Types: E-cigarettes  . Smokeless tobacco: Never Used  Vaping Use  . Vaping Use: Every day  Substance and Sexual Activity  . Alcohol use: Yes    Comment: occ  . Drug use: Yes    Types: Marijuana  . Sexual activity: Not on file  Other Topics Concern  . Not on file  Social History Narrative   Living in [place] with mom. Born and raised in [place] by parents. Pt has 4 older siblings. Pt has graduated HS and did one semester of online college. Pt is working part time in retail. Single, no kids.   Denies any legal problems in the past   Social Determinants of Health   Financial Resource Strain:   . Difficulty of Paying Living Expenses: Not on file  Food Insecurity:   .  Worried About Programme researcher, broadcasting/film/video in the Last Year: Not on file  . Ran Out of Food in the Last Year: Not on file  Transportation Needs:   . Lack of Transportation (Medical): Not on file  . Lack of Transportation (Non-Medical): Not on file  Physical Activity:   . Days of Exercise per Week: Not on file  . Minutes of Exercise per Session: Not on file  Stress:   . Feeling of Stress : Not on file  Social Connections:   . Frequency of Communication with Friends and Family: Not on file  . Frequency of Social Gatherings with Friends and Family: Not on file  . Attends Religious Services: Not on file  . Active Member of Clubs or Organizations: Not on file  . Attends Banker Meetings: Not on file  . Marital Status: Not on file  Intimate Partner Violence:   . Fear of Current or Ex-Partner: Not on file  . Emotionally Abused: Not on file  . Physically Abused: Not on file  . Sexually Abused: Not on file    Current Outpatient Medications on File Prior to Visit  Medication Sig Dispense Refill  . amphetamine-dextroamphetamine (ADDERALL) 20 MG tablet Take 1  tablet (20 mg total) by mouth daily. 30 tablet 0  . sertraline (ZOLOFT) 100 MG tablet Take 1 tablet (100 mg total) by mouth daily. 90 tablet 3  . testosterone cypionate (DEPOTESTOTERONE CYPIONATE) 100 MG/ML injection Inject 0.2 mLs (20 mg total) into the muscle once a week. And syringes 1/week (Patient taking differently: Inject 30 mg into the muscle once a week. And syringes 1/week) 10 mL 0   No current facility-administered medications on file prior to visit.    No Known Allergies  Family History  Problem Relation Age of Onset  . Depression Mother   . Meniere's disease Mother   . Depression Sister   . Depression Brother   . Alcohol abuse Maternal Uncle   . Bipolar disorder Father   . ADD / ADHD Father   . Meniere's disease Maternal Grandmother   . Other Neg Hx        transgender    BP 118/74   Pulse 84   Ht 5\' 7"   (1.702 m)   Wt 138 lb (62.6 kg)   LMP 11/30/2019   SpO2 97%   BMI 21.61 kg/m    Review of Systems Denies sob.      Objective:   Physical Exam VITAL SIGNS:  See vs page GENERAL: no distress EXT: no leg edema.    Lab Results  Component Value Date   TESTOSTERONE 564 (H) 05/12/2020       Assessment & Plan:  Transgender state: well-controlled.  Please continue the same testosterone. Abnormal TSH: recheck today

## 2020-05-13 LAB — TESTOSTERONE,FREE AND TOTAL
Testosterone, Free: 6.7 pg/mL — ABNORMAL HIGH (ref 0.0–4.2)
Testosterone: 564 ng/dL — ABNORMAL HIGH (ref 13–71)

## 2020-07-30 ENCOUNTER — Other Ambulatory Visit: Payer: Self-pay | Admitting: Family Medicine

## 2020-07-30 DIAGNOSIS — F988 Other specified behavioral and emotional disorders with onset usually occurring in childhood and adolescence: Secondary | ICD-10-CM

## 2020-08-02 NOTE — Telephone Encounter (Signed)
Last Ov 04/09/20 Last fill 03/24/20  #30/0

## 2020-08-03 MED ORDER — AMPHETAMINE-DEXTROAMPHETAMINE 20 MG PO TABS: 20.0000 mg | ORAL_TABLET | Freq: Every day | ORAL | 0 refills | Status: DC

## 2020-08-06 ENCOUNTER — Encounter: Payer: Self-pay | Admitting: Family Medicine

## 2020-08-16 ENCOUNTER — Other Ambulatory Visit: Payer: Self-pay | Admitting: Endocrinology

## 2020-08-16 MED ORDER — TESTOSTERONE CYPIONATE 100 MG/ML IM SOLN
20.0000 mg | INTRAMUSCULAR | 0 refills | Status: DC
Start: 2020-08-16 — End: 2021-04-21

## 2020-08-18 ENCOUNTER — Encounter: Payer: Self-pay | Admitting: Endocrinology

## 2020-08-18 ENCOUNTER — Telehealth: Payer: Self-pay

## 2020-08-18 NOTE — Telephone Encounter (Signed)
LVM for pt to let pt know that the insurance company has denied the request for testosterone and Margaret Marshall would like to know if there any alternative that the insurance company would suggest. Advise the pt to contact the insurance company to see if they have an alternative and if so, to let us know.

## 2020-09-30 ENCOUNTER — Encounter: Payer: Self-pay | Admitting: Family Medicine

## 2020-09-30 ENCOUNTER — Ambulatory Visit: Payer: Managed Care, Other (non HMO) | Admitting: Family Medicine

## 2020-09-30 ENCOUNTER — Other Ambulatory Visit: Payer: Self-pay

## 2020-09-30 ENCOUNTER — Other Ambulatory Visit (HOSPITAL_COMMUNITY)
Admission: RE | Admit: 2020-09-30 | Discharge: 2020-09-30 | Disposition: A | Discharge status: Home / Self Care | Payer: Managed Care, Other (non HMO) | Source: Ambulatory Visit | Attending: Family Medicine | Admitting: Family Medicine

## 2020-09-30 VITALS — BP 110/68 | HR 93 | Temp 97.7°F | Ht 67.0 in | Wt 139.4 lb

## 2020-09-30 DIAGNOSIS — Z113 Encounter for screening for infections with a predominantly sexual mode of transmission: Secondary | ICD-10-CM

## 2020-09-30 NOTE — Patient Instructions (Signed)
Health Maintenance Due  Topic Date Due  . HPV VACCINES (1 - 2-dose series) Never done  . COVID-19 Vaccine (3 - Booster for Pfizer series) 04/02/2020    Depression screen [facility] 2/9 03/24/2020 12/17/2019  Decreased Interest 0 0  Down, Depressed, Hopeless 0 0  PHQ - 2 Score 0 0  Altered sleeping 1 0  Tired, decreased energy 0 0  Change in appetite 0 0  Feeling bad or failure about yourself  0 0  Trouble concentrating 1 1  Moving slowly or fidgety/restless 0 0  Suicidal thoughts 0 0  PHQ-9 Score 2 1  Difficult doing work/chores Not difficult at all Not difficult at all  Some encounter information is confidential and restricted. Go to Review Flowsheets activity to see all data.

## 2020-09-30 NOTE — Progress Notes (Signed)
Margaret Marshall is a 25 y.o. adult  Chief Complaint  Patient presents with  . Exposure to STD    Pt here for for STD testing.    HPI: Margaret Marshall is a 25 y.o. adult seen today for STI screening. Pt has no symptoms and no known exposures. He is requesting full STI screening panel.  Pt denies dysuria, genital rash or itching.   Past Medical History:  Diagnosis Date  . Anxiety   . Depression   . Mood disorder [facility])     Past Surgical History:  Procedure Laterality Date  . ABDOMINAL HYSTERECTOMY    . NO PAST SURGERIES      Social History   Socioeconomic History  . Marital status: Single    Spouse name: Not on file  . Number of children: Not on file  . Years of education: Not on file  . Highest education level: Not on file  Occupational History  . Not on file  Tobacco Use  . Smoking status: Former Smoker    Types: E-cigarettes  . Smokeless tobacco: Never Used  Vaping Use  . Vaping Use: Every day  Substance and Sexual Activity  . Alcohol use: Yes    Comment: occ  . Drug use: Yes    Types: Marijuana  . Sexual activity: Not on file  Other Topics Concern  . Not on file  Social History Narrative   Living in [place] with mom. Born and raised in [place] by parents. Pt has 4 older siblings. Pt has graduated HS and did one semester of online college. Pt is working part time in retail. Single, no kids.   Denies any legal problems in the past   Social Determinants of Health   Financial Resource Strain: Not on file  Food Insecurity: Not on file  Transportation Needs: Not on file  Physical Activity: Not on file  Stress: Not on file  Social Connections: Not on file  Intimate Partner Violence: Not on file    Family History  Problem Relation Age of Onset  . Depression Mother   . Meniere's disease Mother   . Depression Sister   . Depression Brother   . Alcohol abuse Maternal Uncle   . Bipolar disorder Father   . ADD / ADHD Father   . Meniere's disease Maternal  Grandmother   . Other Neg Hx        transgender     Immunization History  Administered Date(s) Administered  . Influenza,inj,Quad PF,6+ Mos 03/13/2018, 03/24/2020  . Influenza-Unspecified 04/12/2016, 05/23/2017  . PFIZER(Purple Top)SARS-COV-2 Vaccination 09/11/2019, 10/02/2019  . PPD Test 03/13/2018  . Tdap 03/13/2018    Outpatient Encounter Medications as of 25/12/2020  Medication Sig  . amphetamine-dextroamphetamine (ADDERALL) 20 MG tablet Take 1 tablet (20 mg total) by mouth daily.  . sertraline (ZOLOFT) 100 MG tablet Take 1 tablet (100 mg total) by mouth daily.  Marland Kitchen testosterone cypionate (DEPOTESTOTERONE CYPIONATE) 100 MG/ML injection Inject 0.2 mLs (20 mg total) into the muscle once a week. And syringes 1/week   No facility-administered encounter medications on file as of 09/30/2020.     ROS: Pertinent positives and negatives noted in HPI. Remainder of ROS non-contributory  No Known Allergies  BP 110/68 (BP Location: Left Arm, Patient Position: Sitting, Cuff Size: Normal)   Pulse 93   Temp 97.7 F (36.5 C) (Temporal)   Ht 5\' 7"  (1.702 m)   Wt 139 lb 6.4 oz (63.2 kg)   LMP 11/30/2019   SpO2 97%  BMI 21.83 kg/m   Physical Exam Constitutional:      General: He is not in acute distress.    Appearance: Normal appearance. He is not ill-appearing.  Pulmonary:     Effort: No respiratory distress.  Neurological:     Mental Status: He is alert and oriented to person, place, and time.  Psychiatric:        Mood and Affect: Mood normal.        Behavior: Behavior normal.     A/P:  1. Screening for STDs (sexually transmitted diseases) - no symptoms or known exposure - HIV Antibody (routine testing w rflx) - Hepatitis B surface antigen - RPR - Hepatitis C Antibody - Urine cytology ancillary only([place])    This visit occurred during the SARS-CoV-2 public health emergency.  Safety protocols were in place, including screening questions prior to the visit,  additional usage of staff PPE, and extensive cleaning of exam room while observing appropriate contact time as indicated for disinfecting solutions.

## 2020-10-01 LAB — HIV ANTIBODY (ROUTINE TESTING W REFLEX): HIV 1&2 Ab, 4th Generation: NONREACTIVE

## 2020-10-01 LAB — RPR: RPR Ser Ql: NONREACTIVE

## 2020-10-01 LAB — HEPATITIS C ANTIBODY
Hepatitis C Ab: NONREACTIVE
SIGNAL TO CUT-OFF: 0.01 (ref <1.00)

## 2020-10-01 LAB — HEPATITIS B SURFACE ANTIGEN: Hepatitis B Surface Ag: NONREACTIVE

## 2020-10-05 ENCOUNTER — Other Ambulatory Visit: Payer: Self-pay | Admitting: Family Medicine

## 2020-10-05 DIAGNOSIS — F988 Other specified behavioral and emotional disorders with onset usually occurring in childhood and adolescence: Secondary | ICD-10-CM

## 2020-10-06 MED ORDER — AMPHETAMINE-DEXTROAMPHETAMINE 20 MG PO TABS: 20.0000 mg | ORAL_TABLET | Freq: Every day | ORAL | 0 refills | Status: DC

## 2020-10-06 NOTE — Telephone Encounter (Signed)
Refill request for: Adderall 20 mg  LR 08/03/20, #30, 0 rf LOV 09/30/20 FOV 11/09/20  Dr Cirigliano's patient.  Please review and advise.  Thanks. Dm/cma

## 2020-10-17 ENCOUNTER — Encounter: Payer: Self-pay | Admitting: Family Medicine

## 2020-10-18 NOTE — Telephone Encounter (Signed)
Is there a way that we can track this to make sure it was sent/done?  I didn't see any results.

## 2020-10-21 ENCOUNTER — Other Ambulatory Visit: Payer: Managed Care, Other (non HMO)

## 2020-10-22 ENCOUNTER — Other Ambulatory Visit: Payer: Managed Care, Other (non HMO)

## 2020-10-22 ENCOUNTER — Other Ambulatory Visit: Payer: Self-pay

## 2020-10-22 NOTE — Progress Notes (Signed)
Per orders of Dr. Barron Alvine pt is here to provide urine sample.

## 2020-10-25 LAB — MOLECULAR ANCILLARY ONLY
Chlamydia: NEGATIVE
Comment: NEGATIVE
Comment: NEGATIVE
Comment: NORMAL
Neisseria Gonorrhea: NEGATIVE
Trichomonas: NEGATIVE

## 2020-11-09 ENCOUNTER — Ambulatory Visit (INDEPENDENT_AMBULATORY_CARE_PROVIDER_SITE_OTHER): Payer: Managed Care, Other (non HMO) | Admitting: Endocrinology

## 2020-11-09 ENCOUNTER — Other Ambulatory Visit: Payer: Self-pay

## 2020-11-09 VITALS — BP 124/78 | HR 71 | Ht 67.0 in | Wt 137.0 lb

## 2020-11-09 DIAGNOSIS — Z789 Other specified health status: Secondary | ICD-10-CM

## 2020-11-09 NOTE — Progress Notes (Signed)
Subjective:    Patient ID: Margaret Marshall, adult    DOB: Jul 02, 1995, 25 y.o.   MRN: [id number]  HPI Pt returns for f/u of transgender state (F to M): Surgery: TAH (2021, at [facility]) Medication: testosterone treatmtent since 2017.     Counseling: sees [facility].   SDOH: ins declined elagolix Other: dosage has been limited by polycythemia; he gets free labs at [facility].   Interval hx: he takes meds as rx'ed.  pt states he feels well in general, but he says testosterone is 30 mg q week.  Last testosterone dose was 2 days ago.  Since testosterone was increased, he says physical changes have been noticeable (especially deepening of the voice).   He was also noted to have slight low TSH in 2020 (he has never had thyroid imaging; recheck was normal).   Past Medical History:  Diagnosis Date  . Anxiety   . Depression   . Mood disorder [facility])     Past Surgical History:  Procedure Laterality Date  . ABDOMINAL HYSTERECTOMY    . NO PAST SURGERIES      Social History   Socioeconomic History  . Marital status: Single    Spouse name: Not on file  . Number of children: Not on file  . Years of education: Not on file  . Highest education level: Not on file  Occupational History  . Not on file  Tobacco Use  . Smoking status: Former Smoker    Types: E-cigarettes  . Smokeless tobacco: Never Used  Vaping Use  . Vaping Use: Every day  Substance and Sexual Activity  . Alcohol use: Yes    Comment: occ  . Drug use: Yes    Types: Marijuana  . Sexual activity: Not on file  Other Topics Concern  . Not on file  Social History Narrative   Living in [place] with mom. Born and raised in [place] by parents. Pt has 4 older siblings. Pt has graduated HS and did one semester of online college. Pt is working part time in retail. Single, no kids.   Denies any legal problems in the past   Social Determinants of Health   Financial Resource Strain: Not on file  Food Insecurity: Not on file   Transportation Needs: Not on file  Physical Activity: Not on file  Stress: Not on file  Social Connections: Not on file  Intimate Partner Violence: Not on file    Current Outpatient Medications on File Prior to Visit  Medication Sig Dispense Refill  . amphetamine-dextroamphetamine (ADDERALL) 20 MG tablet Take 1 tablet (20 mg total) by mouth daily. 30 tablet 0  . sertraline (ZOLOFT) 100 MG tablet Take 1 tablet (100 mg total) by mouth daily. 90 tablet 3  . testosterone cypionate (DEPOTESTOTERONE CYPIONATE) 100 MG/ML injection Inject 0.2 mLs (20 mg total) into the muscle once a week. And syringes 1/week 10 mL 0   No current facility-administered medications on file prior to visit.    No Known Allergies  Family History  Problem Relation Age of Onset  . Depression Mother   . Meniere's disease Mother   . Depression Sister   . Depression Brother   . Alcohol abuse Maternal Uncle   . Bipolar disorder Father   . ADD / ADHD Father   . Meniere's disease Maternal Grandmother   . Other Neg Hx        transgender    BP 124/78 (BP Location: Right Arm, Patient Position: Sitting, Cuff Size: Normal)  Pulse 71   Ht 5\' 7"  (1.702 m)   Wt 137 lb (62.1 kg)   LMP 11/30/2019   SpO2 97%   BMI 21.46 kg/m    Review of Systems     Objective:   Physical Exam VITAL SIGNS:  See vs page GENERAL: no distress Ext; no leg edema.    Lab Results  Component Value Date   TESTOSTERONE 667 (H) 11/09/2020      Assessment & Plan:  Transgender stage: testosterone is overcontrolled.  Reduce to 10 mg q week Polycythemia: recheck today  Patient Instructions  Blood tests are requested for you today.  We'll let you know about the results.  Please come back for a follow-up appointment in 6 months.

## 2020-11-09 NOTE — Patient Instructions (Signed)
Blood tests are requested for you today.  We'll let you know about the results.  Please come back for a follow-up appointment in 6 months.   

## 2020-11-10 LAB — TESTOSTERONE,FREE AND TOTAL
Testosterone, Free: 6.3 pg/mL — ABNORMAL HIGH (ref 0.0–4.2)
Testosterone: 667 ng/dL — ABNORMAL HIGH (ref 13–71)

## 2020-11-12 ENCOUNTER — Other Ambulatory Visit (INDEPENDENT_AMBULATORY_CARE_PROVIDER_SITE_OTHER): Payer: Managed Care, Other (non HMO)

## 2020-11-12 ENCOUNTER — Other Ambulatory Visit: Payer: Self-pay

## 2020-11-12 DIAGNOSIS — Z789 Other specified health status: Secondary | ICD-10-CM | POA: Diagnosis not present

## 2020-11-12 LAB — CBC WITH DIFFERENTIAL/PLATELET
Basophils Absolute: 0 10*3/uL (ref 0.0–0.1)
Basophils Relative: 0.6 % (ref 0.0–3.0)
Eosinophils Absolute: 0.1 10*3/uL (ref 0.0–0.7)
Eosinophils Relative: 2.2 % (ref 0.0–5.0)
HCT: 43 % (ref 36.0–46.0)
Hemoglobin: 14.6 g/dL (ref 12.0–15.0)
Lymphocytes Relative: 48.3 % — ABNORMAL HIGH (ref 12.0–46.0)
Lymphs Abs: 2.1 10*3/uL (ref 0.7–4.0)
MCHC: 33.9 g/dL (ref 30.0–36.0)
MCV: 86 fl (ref 78.0–100.0)
Monocytes Absolute: 0.4 10*3/uL (ref 0.1–1.0)
Monocytes Relative: 9.4 % (ref 3.0–12.0)
Neutro Abs: 1.7 10*3/uL (ref 1.4–7.7)
Neutrophils Relative %: 39.5 % — ABNORMAL LOW (ref 43.0–77.0)
Platelets: 232 10*3/uL (ref 150.0–400.0)
RBC: 5 Mil/uL (ref 3.87–5.11)
RDW: 14.1 % (ref 11.5–15.5)
WBC: 4.3 10*3/uL (ref 4.0–10.5)

## 2020-11-18 LAB — ESTRADIOL, FREE
Estradiol, Free: 0.59 pg/mL
Estradiol: 32 pg/mL

## 2020-12-21 ENCOUNTER — Other Ambulatory Visit: Payer: Self-pay

## 2020-12-21 DIAGNOSIS — F332 Major depressive disorder, recurrent severe without psychotic features: Secondary | ICD-10-CM

## 2020-12-21 DIAGNOSIS — F411 Generalized anxiety disorder: Secondary | ICD-10-CM

## 2020-12-21 NOTE — Telephone Encounter (Signed)
Last OV 09/30/20 Last fill 02/19/20  #90/3

## 2020-12-22 MED ORDER — SERTRALINE HCL 100 MG PO TABS: 100.0000 mg | ORAL_TABLET | Freq: Every day | ORAL | 3 refills | Status: DC

## 2020-12-25 ENCOUNTER — Other Ambulatory Visit: Payer: Self-pay | Admitting: Family Medicine

## 2020-12-25 DIAGNOSIS — F988 Other specified behavioral and emotional disorders with onset usually occurring in childhood and adolescence: Secondary | ICD-10-CM

## 2020-12-27 ENCOUNTER — Other Ambulatory Visit: Payer: Self-pay | Admitting: Family Medicine

## 2020-12-27 DIAGNOSIS — F988 Other specified behavioral and emotional disorders with onset usually occurring in childhood and adolescence: Secondary | ICD-10-CM

## 2020-12-29 ENCOUNTER — Encounter: Payer: Self-pay | Admitting: Family Medicine

## 2020-12-29 MED ORDER — AMPHETAMINE-DEXTROAMPHETAMINE 20 MG PO TABS: 20.0000 mg | ORAL_TABLET | Freq: Every day | ORAL | 0 refills | Status: DC

## 2021-02-24 ENCOUNTER — Ambulatory Visit: Payer: Managed Care, Other (non HMO) | Admitting: Nurse Practitioner

## 2021-02-24 ENCOUNTER — Other Ambulatory Visit (HOSPITAL_BASED_OUTPATIENT_CLINIC_OR_DEPARTMENT_OTHER): Payer: Self-pay

## 2021-02-24 ENCOUNTER — Encounter: Payer: Self-pay | Admitting: Nurse Practitioner

## 2021-02-24 ENCOUNTER — Other Ambulatory Visit (HOSPITAL_COMMUNITY)
Admission: RE | Admit: 2021-02-24 | Discharge: 2021-02-24 | Disposition: A | Discharge status: Home / Self Care | Payer: Managed Care, Other (non HMO) | Source: Ambulatory Visit | Attending: Nurse Practitioner | Admitting: Nurse Practitioner

## 2021-02-24 ENCOUNTER — Other Ambulatory Visit: Payer: Self-pay

## 2021-02-24 VITALS — BP 112/80 | HR 90 | Temp 97.6°F | Ht 67.0 in | Wt 136.2 lb

## 2021-02-24 DIAGNOSIS — N760 Acute vaginitis: Secondary | ICD-10-CM

## 2021-02-24 DIAGNOSIS — R102 Pelvic and perineal pain: Secondary | ICD-10-CM | POA: Diagnosis not present

## 2021-02-24 DIAGNOSIS — N764 Abscess of vulva: Secondary | ICD-10-CM | POA: Diagnosis not present

## 2021-02-24 DIAGNOSIS — B9689 Other specified bacterial agents as the cause of diseases classified elsewhere: Secondary | ICD-10-CM | POA: Diagnosis not present

## 2021-02-24 DIAGNOSIS — N898 Other specified noninflammatory disorders of vagina: Secondary | ICD-10-CM | POA: Diagnosis present

## 2021-02-24 MED ORDER — SULFAMETHOXAZOLE-TRIMETHOPRIM 800-160 MG PO TABS
1.0000 | ORAL_TABLET | Freq: Two times a day (BID) | ORAL | 0 refills | Status: DC
  Filled 2021-02-24: qty 20, 10d supply, fill #0

## 2021-02-24 MED ORDER — KETOROLAC TROMETHAMINE 60 MG/2ML IM SOLN
30.0000 mg | Freq: Once | INTRAMUSCULAR | Status: AC
Start: 2021-02-24 — End: 2021-03-01

## 2021-02-24 MED ORDER — IBUPROFEN 600 MG PO TABS
600.0000 mg | ORAL_TABLET | Freq: Three times a day (TID) | ORAL | 0 refills | Status: DC | PRN
  Filled 2021-02-24: qty 30, 10d supply, fill #0

## 2021-02-24 NOTE — Progress Notes (Signed)
Subjective:  Patient ID: Margaret Marshall, adult    DOB: 07-08-1995  Age: 25 y.o. MRN: [id number]  CC: Acute Visit (Pt c/o pain in thigh and groin area due to a injury during sex. Pt states he also nicked his self when trimming pubic hair and has a lot of pain and swelling. )  Groin Pain This is a new problem. The current episode started in the past 7 days. The problem occurs constantly. The problem has been gradually worsening. Pertinent negatives include no fever, joint swelling, myalgias, numbness, swollen glands or weakness. The symptoms are aggravated by walking and standing. He has tried acetaminophen and NSAIDs for the symptoms. The treatment provided mild relief.   Reviewed past Medical, Social and Family history today.  Outpatient Medications Prior to Visit  Medication Sig Dispense Refill   amphetamine-dextroamphetamine (ADDERALL) 20 MG tablet Take 1 tablet (20 mg total) by mouth daily. 30 tablet 0   sertraline (ZOLOFT) 100 MG tablet Take 1 tablet (100 mg total) by mouth daily. 90 tablet 3   testosterone cypionate (DEPOTESTOTERONE CYPIONATE) 100 MG/ML injection Inject 0.2 mLs (20 mg total) into the muscle once a week. And syringes 1/week 10 mL 0   No facility-administered medications prior to visit.    ROS See HPI  Objective:  BP 112/80 (BP Location: Left Arm, Patient Position: Sitting, Cuff Size: Normal)   Pulse 90   Temp 97.6 F (36.4 C) (Temporal)   Ht 5\' 7"  (1.702 m)   Wt 136 lb 3.2 oz (61.8 kg)   LMP 11/30/2019   SpO2 98%   BMI 21.33 kg/m   Physical Exam Vitals reviewed. Exam conducted with a chaperone present.  Cardiovascular:     Rate and Rhythm: Normal rate.     Pulses: Normal pulses.  Pulmonary:     Effort: Pulmonary effort is normal.  Genitourinary:    Labia:        Left: Tenderness and lesion present.        Comments: Swelling and induration on left labia majora. Skin intact Neurological:     Mental Status: He is alert.   Assessment & Plan:  This  visit occurred during the SARS-CoV-2 public health emergency.  Safety protocols were in place, including screening questions prior to the visit, additional usage of staff PPE, and extensive cleaning of exam room while observing appropriate contact time as indicated for disinfecting solutions.   Rekha was seen today for acute visit.  Diagnoses and all orders for this visit:  Abscess of labia majora -     sulfamethoxazole-trimethoprim (BACTRIM DS) 800-160 MG tablet; Take 1 tablet by mouth 2 (two) times daily. -     ibuprofen (ADVIL) 600 MG tablet; Take 1 tablet (600 mg total) by mouth every 8 (eight) hours as needed. -     ketorolac (TORADOL) injection 30 mg  Vaginal discharge -     Cervicovaginal ancillary only( [place])  Vulvar pain -     ketorolac (TORADOL) injection 30 mg  Apply warm compress or perform sitz bath 2-3x/day, [name] each. Go to ED if you develop fever or worsening pelvic pain. Use ibuprofen for pain, take with food. Provided worknote. F/up on Wednesday at 2pm  Problem List Items Addressed This Visit   None Visit Diagnoses     Abscess of labia majora    -  Primary   Relevant Medications   sulfamethoxazole-trimethoprim (BACTRIM DS) 800-160 MG tablet   ibuprofen (ADVIL) 600 MG tablet   ketorolac (TORADOL) injection 30  mg (Start on 02/24/2021 10:00 AM)   Vaginal discharge       Relevant Orders   Cervicovaginal ancillary only( [employer] [facility])   Vulvar pain       Relevant Medications   ketorolac (TORADOL) injection 30 mg (Start on 02/24/2021 10:00 AM)       Follow-up: Return in about 5 days (around 03/01/2021) for abscess re eval.  Alysia Penna, NP

## 2021-02-24 NOTE — Patient Instructions (Addendum)
Apply warm compress or perform sitz bath 2-3x/day, each. Go to ED if you develop fever or worsening pelvic pain. F/up on Wednesday at 2pm  How to Take a [employer] A sitz bath is a warm water bath that may be used to care for your rectum, genital area, or the area between your rectum and genitals (perineum). In a sitz bath, the water only comes up to your hips and covers your buttocks. A sitz bath may be done in a bathtub or with a portable sitz bath that fits over the toilet. Your health care provider may recommend a sitz bath to help: Relieve pain and discomfort after delivering a baby. Relieve pain and itching from hemorrhoids or anal fissures. Relieve pain after certain surgeries. Relax muscles that are sore or tight. How to take a sitz bath Take 3-4 sitz baths a day, or as many as told by your health care provider. Bathtub sitz bath To take a sitz bath in a bathtub: Partially fill a bathtub with warm water. The water should be deep enough to cover your hips and buttocks when you are sitting in the tub. Follow your health care provider's instructions if you are told to put medicine in the water. Sit in the water. Open the tub drain a little, and leave it open during your bath. Turn on the warm water again, enough to replace the water that is draining out. Keep the water running throughout your bath. This helps keep the water at the right level and temperature. Soak in the water for 15-20 minutes, or as long as told by your health care provider. When you are done, be careful when you stand up. You may feel dizzy. After the sitz bath, pat yourself dry. Do not rub your skin to dry it.  Over-the-toilet sitz bath To take a sitz bath with an over-the-toilet basin: Follow the manufacturer's instructions. Fill the basin with warm water. Follow your health care provider's instructions if you were told to put medicine in the water. Sit on the seat. Make sure the water covers your buttocks  and perineum. Soak in the water for 15-20 minutes, or as long as told by your health care provider. After the sitz bath, pat yourself dry. Do not rub your skin to dry it. Clean and dry the basin between uses. Discard the basin if it cracks, or according to the manufacturer's instructions.  Contact a health care provider if: Your pain or itching gets worse. Do not continue with sitz baths if your symptoms get worse. You have new symptoms. Do not continue with sitz baths until you talk with your health care provider. Summary A sitz bath is a warm water bath in which the water only comes up to your hips and covers your buttocks. A sitz bath may help relieve pain and discomfort after delivering a baby. It also may help with pain and itching from hemorrhoids or anal fissures, or pain after certain surgeries. It can also help to relax muscles that are sore or tight. Take 3-4 sitz baths a day, or as many as told by your health care provider. Soak in the water for 15-20 minutes. Do not continue with sitz baths if your symptoms get worse. This information is not intended to replace advice given to you by your health care provider. Make sure you discuss any questions you have with your health care provider. Document Revised: 02/26/2020 Document Reviewed: 02/26/2020 Elsevier Patient Education  2022 [employer].

## 2021-02-25 ENCOUNTER — Encounter: Payer: Self-pay | Admitting: Nurse Practitioner

## 2021-02-25 ENCOUNTER — Other Ambulatory Visit (HOSPITAL_BASED_OUTPATIENT_CLINIC_OR_DEPARTMENT_OTHER): Payer: Self-pay

## 2021-02-25 LAB — CERVICOVAGINAL ANCILLARY ONLY
Bacterial Vaginitis (gardnerella): POSITIVE — AB
Candida Glabrata: NEGATIVE
Candida Vaginitis: NEGATIVE
Chlamydia: NEGATIVE
Comment: NEGATIVE
Comment: NEGATIVE
Comment: NEGATIVE
Comment: NEGATIVE
Comment: NEGATIVE
Comment: NORMAL
Neisseria Gonorrhea: NEGATIVE
Trichomonas: NEGATIVE

## 2021-02-25 MED ORDER — METRONIDAZOLE 500 MG PO TABS
500.0000 mg | ORAL_TABLET | Freq: Two times a day (BID) | ORAL | 0 refills | Status: AC
  Filled 2021-02-25: qty 14, 7d supply, fill #0

## 2021-02-25 NOTE — Addendum Note (Signed)
Addended by: Michaela Corner on: 02/25/2021 04:18 PM   Modules accepted: Orders

## 2021-03-02 ENCOUNTER — Encounter: Payer: Self-pay | Admitting: Nurse Practitioner

## 2021-03-02 ENCOUNTER — Other Ambulatory Visit: Payer: Self-pay

## 2021-03-02 ENCOUNTER — Telehealth: Payer: Self-pay | Admitting: Nurse Practitioner

## 2021-03-02 ENCOUNTER — Ambulatory Visit (INDEPENDENT_AMBULATORY_CARE_PROVIDER_SITE_OTHER): Payer: Managed Care, Other (non HMO) | Admitting: Nurse Practitioner

## 2021-03-02 ENCOUNTER — Ambulatory Visit (HOSPITAL_BASED_OUTPATIENT_CLINIC_OR_DEPARTMENT_OTHER)
Admission: RE | Admit: 2021-03-02 | Discharge: 2021-03-02 | Disposition: A | Discharge status: Home / Self Care | Payer: Managed Care, Other (non HMO) | Source: Ambulatory Visit | Attending: Nurse Practitioner | Admitting: Nurse Practitioner

## 2021-03-02 VITALS — BP 116/66 | HR 90 | Temp 97.4°F | Wt 134.6 lb

## 2021-03-02 DIAGNOSIS — N764 Abscess of vulva: Secondary | ICD-10-CM | POA: Diagnosis not present

## 2021-03-02 DIAGNOSIS — R599 Enlarged lymph nodes, unspecified: Secondary | ICD-10-CM | POA: Diagnosis not present

## 2021-03-02 DIAGNOSIS — R590 Localized enlarged lymph nodes: Secondary | ICD-10-CM | POA: Diagnosis present

## 2021-03-02 DIAGNOSIS — R9389 Abnormal findings on diagnostic imaging of other specified body structures: Secondary | ICD-10-CM | POA: Diagnosis not present

## 2021-03-02 MED ORDER — DOXYCYCLINE HYCLATE 100 MG PO TABS: 100.0000 mg | ORAL_TABLET | Freq: Two times a day (BID) | ORAL | 0 refills | Status: DC

## 2021-03-02 NOTE — Progress Notes (Signed)
Subjective:  Patient ID: Margaret Marshall, adult    DOB: 01/08/1996  Age: 25 y.o. MRN: [id number]  CC: Acute Visit (F/u on abscess, pt states abscess is healing but there is a lump. )  HPI Margaret Marshall is here for labia abscess re eval. He denies any adverse effects with bactrim and metronidazole. He states labia swelling has improved but he developed swelling and pain in groin region. He sates he had some tenderness in that region last week, but no swelling. He has been taking ibuprofen every 8hrs for pain He denies any fever or fatigue or upper respiratory symptoms or rash. No exposure to cats or their litter box. He has a dog at home.  Reviewed past Medical, Social and Family history today.  Outpatient Medications Prior to Visit  Medication Sig Dispense Refill   amphetamine-dextroamphetamine (ADDERALL) 20 MG tablet Take 1 tablet (20 mg total) by mouth daily. 30 tablet 0   ibuprofen (ADVIL) 600 MG tablet Take 1 tablet (600 mg total) by mouth every 8 (eight) hours as needed. 30 tablet 0   metroNIDAZOLE (FLAGYL) 500 MG tablet Take 1 tablet (500 mg total) by mouth 2 (two) times daily for 7 days. 14 tablet 0   sertraline (ZOLOFT) 100 MG tablet Take 1 tablet (100 mg total) by mouth daily. 90 tablet 3   testosterone cypionate (DEPOTESTOTERONE CYPIONATE) 100 MG/ML injection Inject 0.2 mLs (20 mg total) into the muscle once a week. And syringes 1/week 10 mL 0   sulfamethoxazole-trimethoprim (BACTRIM DS) 800-160 MG tablet Take 1 tablet by mouth 2 (two) times daily. 20 tablet 0   No facility-administered medications prior to visit.    ROS See HPI  Objective:  BP 116/66 (BP Location: Left Arm, Patient Position: Sitting, Cuff Size: Normal)   Pulse 90   Temp (!) 97.4 F (36.3 C) (Temporal)   Wt 134 lb 9.6 oz (61.1 kg)   LMP 11/30/2019   SpO2 98%   BMI 21.08 kg/m   Physical Exam Genitourinary:    Labia:        Right: No rash or tenderness.        Left: No rash or tenderness.         Comments: Resolved left labia swelling and redness. Small residual nodule on posterior region of labia (no fluctuance, no induration, no tenderness). Bilateral inguinal lymphadenopathy(erythematous and indurated). Lymphadenopathy:     Lower Body: Right inguinal adenopathy present. Left inguinal adenopathy present.   Assessment & Plan:  This visit occurred during the SARS-CoV-2 public health emergency.  Safety protocols were in place, including screening questions prior to the visit, additional usage of staff PPE, and extensive cleaning of exam room while observing appropriate contact time as indicated for disinfecting solutions.   Margaret Marshall was seen today for acute visit.  Diagnoses and all orders for this visit:  Inguinal lymphadenopathy -     CBC with Differential/Platelet -     HIV antibody (with reflex) -     doxycycline (VIBRA-TABS) 100 MG tablet; Take 1 tablet (100 mg total) by mouth 2 (two) times daily for 7 days. -     US Pelvis Limited; Future -     Epstein-Barr virus VCA, IgM -     C-reactive protein -     CT PELVIS W CONTRAST; Future  Abscess of labia majora -     doxycycline (VIBRA-TABS) 100 MG tablet; Take 1 tablet (100 mg total) by mouth 2 (two) times daily for 7 days.  Abnormal finding  on imaging -     CT PELVIS W CONTRAST; Future  Enlarged lymph nodes  I do not have Rocephin to administer in office. Switched bactrim to doxycycline. He is schedule for pelvic US today to r/o inguinal abscess Consider referral to general surgery if abscess present.  Problem List Items Addressed This Visit   None Visit Diagnoses     Inguinal lymphadenopathy    -  Primary   Relevant Medications   doxycycline (VIBRA-TABS) 100 MG tablet   Other Relevant Orders   CBC with Differential/Platelet   HIV antibody (with reflex)   US Pelvis Limited (Completed)   Epstein-Barr virus VCA, IgM   C-reactive protein   CT PELVIS W CONTRAST   Abscess of labia majora       Relevant Medications    doxycycline (VIBRA-TABS) 100 MG tablet   Abnormal finding on imaging       Relevant Orders   CT PELVIS W CONTRAST   Enlarged lymph nodes           Follow-up: No follow-ups on file.  Alysia Penna, NP

## 2021-03-02 NOTE — Patient Instructions (Addendum)
Stop bactrim Start doxycycline Continue metronidazole  Go for pelvic US at [facility]: [address]  [facility], [place]

## 2021-03-02 NOTE — Telephone Encounter (Signed)
Need to discuss pelvic US findings and need for CT pelvis

## 2021-03-03 LAB — C-REACTIVE PROTEIN: CRP: 7.6 mg/dL (ref 0.5–20.0)

## 2021-03-03 LAB — CBC WITH DIFFERENTIAL/PLATELET
Basophils Absolute: 0.1 10*3/uL (ref 0.0–0.1)
Basophils Relative: 0.8 % (ref 0.0–3.0)
Eosinophils Absolute: 0.1 10*3/uL (ref 0.0–0.7)
Eosinophils Relative: 1.4 % (ref 0.0–5.0)
HCT: 38.6 % (ref 36.0–46.0)
Hemoglobin: 12.8 g/dL (ref 12.0–15.0)
Lymphocytes Relative: 32.3 % (ref 12.0–46.0)
Lymphs Abs: 2.6 10*3/uL (ref 0.7–4.0)
MCHC: 33.2 g/dL (ref 30.0–36.0)
MCV: 87.1 fl (ref 78.0–100.0)
Monocytes Absolute: 0.4 10*3/uL (ref 0.1–1.0)
Monocytes Relative: 5.1 % (ref 3.0–12.0)
Neutro Abs: 4.9 10*3/uL (ref 1.4–7.7)
Neutrophils Relative %: 60.4 % (ref 43.0–77.0)
Platelets: 503 10*3/uL — ABNORMAL HIGH (ref 150.0–400.0)
RBC: 4.43 Mil/uL (ref 3.87–5.11)
RDW: 13.9 % (ref 11.5–15.5)
WBC: 8.1 10*3/uL (ref 4.0–10.5)

## 2021-03-03 LAB — EPSTEIN-BARR VIRUS VCA, IGM: EBV VCA IgM: <36 U/mL

## 2021-03-03 LAB — HIV ANTIBODY (ROUTINE TESTING W REFLEX): HIV 1&2 Ab, 4th Generation: NONREACTIVE

## 2021-03-03 NOTE — Telephone Encounter (Signed)
Pt called our after hours service and is wanting a call back concerning this issue. Please advise.

## 2021-03-03 NOTE — Progress Notes (Signed)
abnormal, Inform patient about inflammatory process in groin region and need for CT pelvis with contrast. He verbalized understanding and agreed to proceed.

## 2021-03-04 ENCOUNTER — Telehealth: Payer: Self-pay | Admitting: Nurse Practitioner

## 2021-03-04 DIAGNOSIS — R590 Localized enlarged lymph nodes: Secondary | ICD-10-CM

## 2021-03-04 NOTE — Telephone Encounter (Signed)
Advised about need for repeat RPR. He agreed to go to lab on 03/07/2021 at 11am for blood draw. He states pelvic pain has improved and denies any fever.

## 2021-03-04 NOTE — Telephone Encounter (Signed)
Lab appointment scheduled 

## 2021-03-07 ENCOUNTER — Other Ambulatory Visit: Payer: Self-pay

## 2021-03-07 ENCOUNTER — Other Ambulatory Visit: Payer: Managed Care, Other (non HMO)

## 2021-03-07 DIAGNOSIS — R590 Localized enlarged lymph nodes: Secondary | ICD-10-CM

## 2021-03-08 LAB — RPR: RPR Ser Ql: NONREACTIVE

## 2021-03-09 ENCOUNTER — Other Ambulatory Visit: Payer: Self-pay

## 2021-03-09 DIAGNOSIS — F988 Other specified behavioral and emotional disorders with onset usually occurring in childhood and adolescence: Secondary | ICD-10-CM

## 2021-03-10 ENCOUNTER — Telehealth: Payer: Self-pay | Admitting: Nurse Practitioner

## 2021-03-10 ENCOUNTER — Telehealth: Payer: Self-pay

## 2021-03-10 NOTE — Telephone Encounter (Signed)
Patient notified VIA phone.  No questions.  Dm/cma ? ?

## 2021-03-10 NOTE — Telephone Encounter (Signed)
Pt states he finished medication today and the pain and swelling has improved. He states he has imaging scheduled for next week.

## 2021-03-10 NOTE — Telephone Encounter (Signed)
Refill request for:  Adderall 20 mg LR 12/29/20, #30, 0 rf LOV  03/02/21 FOV 06/01/21  He only uses as needed basis. Please review and advise.  Thanks. Dm/cma

## 2021-03-11 ENCOUNTER — Encounter: Payer: Self-pay | Admitting: Nurse Practitioner

## 2021-03-11 NOTE — Telephone Encounter (Signed)
Spoke to patient and scheduled an appointment for 03/31/21@9 :00 am. Dm/cma

## 2021-03-14 ENCOUNTER — Ambulatory Visit
Admission: RE | Admit: 2021-03-14 | Discharge: 2021-03-14 | Disposition: A | Discharge status: Home / Self Care | Payer: Managed Care, Other (non HMO) | Source: Ambulatory Visit | Attending: Nurse Practitioner | Admitting: Nurse Practitioner

## 2021-03-14 ENCOUNTER — Telehealth: Payer: Self-pay | Admitting: Nurse Practitioner

## 2021-03-14 ENCOUNTER — Other Ambulatory Visit: Payer: Self-pay

## 2021-03-14 DIAGNOSIS — R9389 Abnormal findings on diagnostic imaging of other specified body structures: Secondary | ICD-10-CM

## 2021-03-14 DIAGNOSIS — R590 Localized enlarged lymph nodes: Secondary | ICD-10-CM

## 2021-03-14 MED ORDER — IOPAMIDOL (ISOVUE-370) INJECTION 76%
80.0000 mL | Freq: Once | INTRAVENOUS | Status: AC | PRN
  Administered 2021-03-14: 80 mL via INTRAVENOUS

## 2021-03-14 NOTE — Telephone Encounter (Signed)
Called and spoke to patient. All issues resolved. Appt scheduled 03/31/2021. Sw, cma

## 2021-03-14 NOTE — Telephone Encounter (Signed)
Please give Dr. Nadene Rubins from [facility] a call concerning pt's CT PELVIS W CONTRAST (Accession [id number]) (Order [id number]).  [phone].

## 2021-03-16 ENCOUNTER — Encounter: Payer: Self-pay | Admitting: Nurse Practitioner

## 2021-03-16 DIAGNOSIS — M899 Disorder of bone, unspecified: Secondary | ICD-10-CM | POA: Insufficient documentation

## 2021-03-16 MED ORDER — DOXYCYCLINE HYCLATE 100 MG PO TABS
100.0000 mg | ORAL_TABLET | Freq: Two times a day (BID) | ORAL | 0 refills | Status: AC
Start: 2021-03-16 — End: 2021-03-23

## 2021-03-16 NOTE — Addendum Note (Signed)
Addended by: Alysia Penna L on: 03/16/2021 12:27 PM   Modules accepted: Orders

## 2021-03-18 NOTE — Telephone Encounter (Signed)
CT was already completed and reviewed by provider. Dm/cma

## 2021-03-31 ENCOUNTER — Ambulatory Visit: Payer: 59 | Admitting: Nurse Practitioner

## 2021-03-31 ENCOUNTER — Other Ambulatory Visit: Payer: Self-pay

## 2021-03-31 ENCOUNTER — Encounter: Payer: Self-pay | Admitting: Nurse Practitioner

## 2021-03-31 VITALS — BP 112/60 | HR 78 | Temp 97.8°F | Ht 67.0 in | Wt 138.0 lb

## 2021-03-31 DIAGNOSIS — F3342 Major depressive disorder, recurrent, in full remission: Secondary | ICD-10-CM

## 2021-03-31 DIAGNOSIS — F988 Other specified behavioral and emotional disorders with onset usually occurring in childhood and adolescence: Secondary | ICD-10-CM | POA: Diagnosis not present

## 2021-03-31 DIAGNOSIS — F411 Generalized anxiety disorder: Secondary | ICD-10-CM | POA: Diagnosis not present

## 2021-03-31 DIAGNOSIS — Z23 Encounter for immunization: Secondary | ICD-10-CM | POA: Diagnosis not present

## 2021-03-31 DIAGNOSIS — Z79899 Other long term (current) drug therapy: Secondary | ICD-10-CM | POA: Diagnosis not present

## 2021-03-31 MED ORDER — AMPHETAMINE-DEXTROAMPHETAMINE 10 MG PO TABS: 10.0000 mg | ORAL_TABLET | Freq: Every day | ORAL | 0 refills | Status: DC

## 2021-03-31 MED ORDER — SERTRALINE HCL 100 MG PO TABS: 100.0000 mg | ORAL_TABLET | Freq: Every day | ORAL | 3 refills | Status: AC

## 2021-03-31 NOTE — Progress Notes (Signed)
Subjective:  Patient ID: Margaret Marshall, adult    DOB: Jan 22, 1996  Age: 25 y.o. MRN: [id number]  CC: Acute Visit (Pt would like a refill on ADHD medication. Pt states he has not had medication x 2 weeks. /Flu vaccine given today. )   HPI Resolved inguinal lymphadenopathy and groin pain. Completed oral antibiotics as prescribed.  Depression Stable mood with zoloft Denies need for CBT at this time. No SI/HI/hallucination or delusional thoughts.  Maintain current dose Refill sent  Attention deficit disorder (ADD) without hyperactivity [facility], then his previous pcp: Dr. Barron Alvine. Current use of 10-20mg  of Adderall IR 5x/week Denies any change in dose in last 80yrs. Denies any mood swings, no insomnia, no anorexia. No ETOH use, no tobacco use Occasional marijuana use. Advised to avoid use.  Agreed to sign non opioid controlled substance contract and UDS collection today. PMP database reviewed: Adderall 10mg  filled 01/08/21, 08/03/20, 03/24/20 #30 tabs each. No red flags noted. Refill sent today F/up in 61months  Reviewed past Medical, Social and Family history today.  Outpatient Medications Prior to Visit  Medication Sig Dispense Refill   testosterone cypionate (DEPOTESTOTERONE CYPIONATE) 100 MG/ML injection Inject 0.2 mLs (20 mg total) into the muscle once a week. And syringes 1/week 10 mL 0   amphetamine-dextroamphetamine (ADDERALL) 20 MG tablet Take 1 tablet (20 mg total) by mouth daily. 30 tablet 0   sertraline (ZOLOFT) 100 MG tablet Take 1 tablet (100 mg total) by mouth daily. 90 tablet 3   ibuprofen (ADVIL) 600 MG tablet Take 1 tablet (600 mg total) by mouth every 8 (eight) hours as needed. 30 tablet 0   No facility-administered medications prior to visit.    ROS See HPI  Objective:  BP 112/60 (BP Location: Left Arm, Patient Position: Sitting, Cuff Size: Normal)   Pulse 78   Temp 97.8 F (36.6 C) (Temporal)   Ht 5\' 7"  (1.702 m)    Wt 138 lb (62.6 kg)   LMP 11/30/2019   SpO2 99%   BMI 21.61 kg/m   Physical Exam Vitals reviewed.  Constitutional:      General: He is not in acute distress. Cardiovascular:     Rate and Rhythm: Normal rate.     Pulses: Normal pulses.  Pulmonary:     Effort: Pulmonary effort is normal.  Neurological:     Mental Status: He is alert and oriented to person, place, and time.  Psychiatric:        Mood and Affect: Mood normal.        Behavior: Behavior normal.        Thought Content: Thought content normal.    Assessment & Plan:  This visit occurred during the SARS-CoV-2 public health emergency.  Safety protocols were in place, including screening questions prior to the visit, additional usage of staff PPE, and extensive cleaning of exam room while observing appropriate contact time as indicated for disinfecting solutions.   Dynisha was seen today for acute visit.  Diagnoses and all orders for this visit:  Recurrent major depressive disorder, in full remission (HCC) -     sertraline (ZOLOFT) 100 MG tablet; Take 1 tablet (100 mg total) by mouth daily.  Attention deficit disorder (ADD) without hyperactivity -     01/30/2020 11+Oxyco+Alc+Crt-Bund -     amphetamine-dextroamphetamine (ADDERALL) 10 MG tablet; Take 1-2 tablets (10-20 mg total) by mouth daily. Drug Holiday on weekend  Controlled substance agreement signed -     [phone] 11+Oxyco+Alc+Crt-Bund  GAD (generalized anxiety disorder) -     sertraline (ZOLOFT) 100 MG tablet; Take 1 tablet (100 mg total) by mouth daily.  Flu vaccine need -     Flu Vaccine QUAD 6+ mos PF IM (Fluarix Quad PF)   Problem List Items Addressed This Visit       Other   Attention deficit disorder (ADD) without hyperactivity    [facility], then his previous pcp: Dr. Barron Alvine. Current use of 10-20mg  of Adderall IR 5x/week Denies any change in dose in last 7yrs. Denies any mood swings, no insomnia, no anorexia. No  ETOH use, no tobacco use Occasional marijuana use. Advised to avoid use.  Agreed to sign non opioid controlled substance contract and UDS collection today. PMP database reviewed: Adderall 10mg  filled 01/08/21, 08/03/20, 03/24/20 #30 tabs each. No red flags noted. Refill sent today F/up in 90months      Relevant Medications   amphetamine-dextroamphetamine (ADDERALL) 10 MG tablet   Other Relevant Orders   1month 11+Oxyco+Alc+Crt-Bund   Controlled substance agreement signed   Relevant Orders   [id number] 11+Oxyco+Alc+Crt-Bund   Depression - Primary    Stable mood with zoloft Denies need for CBT at this time. No SI/HI/hallucination or delusional thoughts.  Maintain current dose Refill sent      Relevant Medications   sertraline (ZOLOFT) 100 MG tablet   Other Visit Diagnoses     GAD (generalized anxiety disorder)       Relevant Medications   sertraline (ZOLOFT) 100 MG tablet   Flu vaccine need       Relevant Orders   Flu Vaccine QUAD 6+ mos PF IM (Fluarix Quad PF) (Completed)       Follow-up: Return in about 3 months (around 07/01/2021) for CPE (fasting) and  ADHD f/up.  08/29/2021, NP

## 2021-03-31 NOTE — Assessment & Plan Note (Signed)
[facility], then his previous pcp: Dr. Barron Alvine. Current use of 10-20mg  of Adderall IR 5x/week Denies any change in dose in last 54yrs. Denies any mood swings, no insomnia, no anorexia. No ETOH use, no tobacco use Occasional marijuana use. Advised to avoid use.  Agreed to sign non opioid controlled substance contract and UDS collection today. PMP database reviewed: Adderall 10mg  filled 01/08/21, 08/03/20, 03/24/20 #30 tabs each. No red flags noted. Refill sent today F/up in 59months

## 2021-03-31 NOTE — Patient Instructions (Addendum)
Go to lab for urine collection.   

## 2021-03-31 NOTE — Assessment & Plan Note (Signed)
Stable mood with zoloft Denies need for CBT at this time. No SI/HI/hallucination or delusional thoughts.  Maintain current dose Refill sent

## 2021-04-08 LAB — DRUG SCREEN 764883 11+OXYCO+ALC+CRT-BUND
Amphetamines, Urine: NEGATIVE ng/mL
BENZODIAZ UR QL: NEGATIVE ng/mL
Barbiturate: NEGATIVE ng/mL
Cocaine (Metabolite): NEGATIVE ng/mL
Creatinine: 158.2 mg/dL (ref 20.0–300.0)
Ethanol: NEGATIVE %
Meperidine: NEGATIVE ng/mL
Methadone Screen, Urine: NEGATIVE ng/mL
OPIATE SCREEN URINE: NEGATIVE ng/mL
Oxycodone/Oxymorphone, Urine: NEGATIVE ng/mL
Phencyclidine: NEGATIVE ng/mL
Propoxyphene: NEGATIVE ng/mL
Tramadol: NEGATIVE ng/mL
pH, Urine: 5.5 (ref 4.5–8.9)

## 2021-04-08 LAB — CANNABINOID CONFIRMATION, UR
CANNABINOIDS: POSITIVE — AB
Carboxy THC GC/MS Conf: 143 ng/mL

## 2021-04-11 ENCOUNTER — Telehealth: Payer: Self-pay | Admitting: Nurse Practitioner

## 2021-04-11 NOTE — Telephone Encounter (Signed)
Pt called 04/10/21 to the after hours nurse and said that they received a mychart message that their insurance wasn't billed for visit from 03/31/21. I looked and we dont have an active insurance on file so I call pt to see if I could get insurance added that would have been active for that appt date but had to leave a message.

## 2021-04-20 ENCOUNTER — Other Ambulatory Visit: Payer: Self-pay | Admitting: Endocrinology

## 2021-04-21 ENCOUNTER — Other Ambulatory Visit: Payer: Self-pay | Admitting: Endocrinology

## 2021-04-21 MED ORDER — TESTOSTERONE CYPIONATE 200 MG/ML IM SOLN: 10.0000 mg | INTRAMUSCULAR | 0 refills | Status: AC

## 2021-04-23 ENCOUNTER — Other Ambulatory Visit: Payer: Self-pay | Admitting: Nurse Practitioner

## 2021-04-23 DIAGNOSIS — F3342 Major depressive disorder, recurrent, in full remission: Secondary | ICD-10-CM

## 2021-04-23 DIAGNOSIS — F411 Generalized anxiety disorder: Secondary | ICD-10-CM

## 2021-04-28 ENCOUNTER — Encounter: Payer: Self-pay | Admitting: Endocrinology

## 2021-05-12 ENCOUNTER — Ambulatory Visit: Payer: Managed Care, Other (non HMO) | Admitting: Endocrinology

## 2021-05-25 NOTE — Telephone Encounter (Signed)
Do you want to refill for 90 day supply or address at upcoming appointment on 06/01/21? Please advise

## 2021-06-01 ENCOUNTER — Encounter: Payer: Managed Care, Other (non HMO) | Admitting: Nurse Practitioner

## 2021-06-19 ENCOUNTER — Other Ambulatory Visit: Payer: Self-pay | Admitting: Nurse Practitioner

## 2021-06-19 DIAGNOSIS — F988 Other specified behavioral and emotional disorders with onset usually occurring in childhood and adolescence: Secondary | ICD-10-CM

## 2021-06-21 MED ORDER — AMPHETAMINE-DEXTROAMPHETAMINE 10 MG PO TABS: 10.0000 mg | ORAL_TABLET | Freq: Every day | ORAL | 0 refills | Status: AC

## 2021-06-21 NOTE — Telephone Encounter (Signed)
Refill request for:  Adderall 10 mg LR 03/31/21, #30, 0 rf LOV 03/31/21 FOV  none scheduled.   Please review and advise.  Thanks.  Dm/cma

## 2023-04-04 IMAGING — US US PELVIS LIMITED
1 series · 13 of 25 positions shown · non-contrast
Comparison: None

CLINICAL DATA: LEFT groin swelling, potential adenopathy versus
abscess.

EXAM:
US PELVIS LIMITED
TECHNIQUE: Multiplanar, real-time, grayscale and color Doppler imaging of the
RIGHT and LEFT groin was performed.

[Series 1: us pelvis limited · 27 acquisitions, 13 frames shown]
[im 1/27]
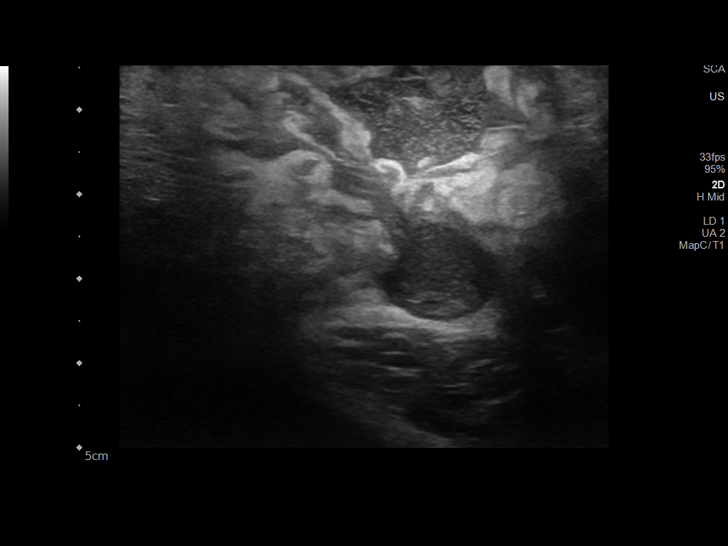
[im 3/27]
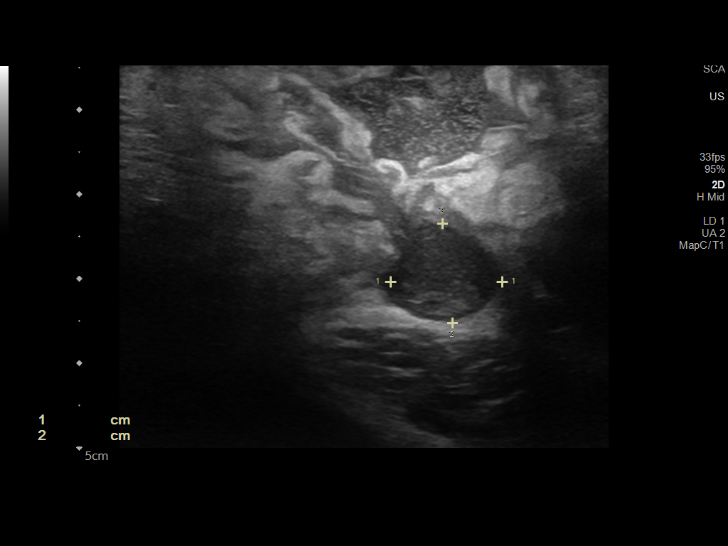
[im 5/27]
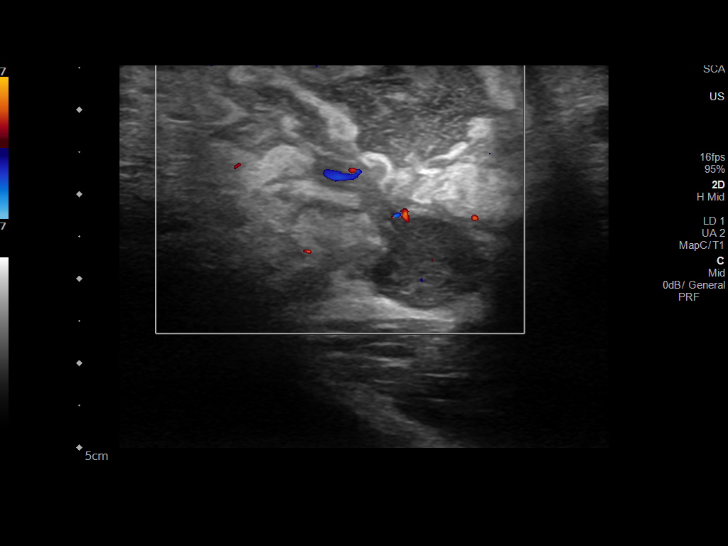
[im 7/27]
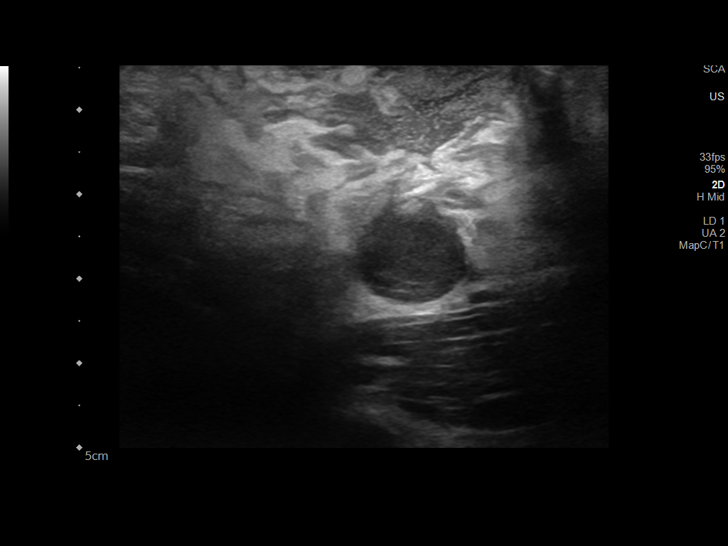
[im 9/27]
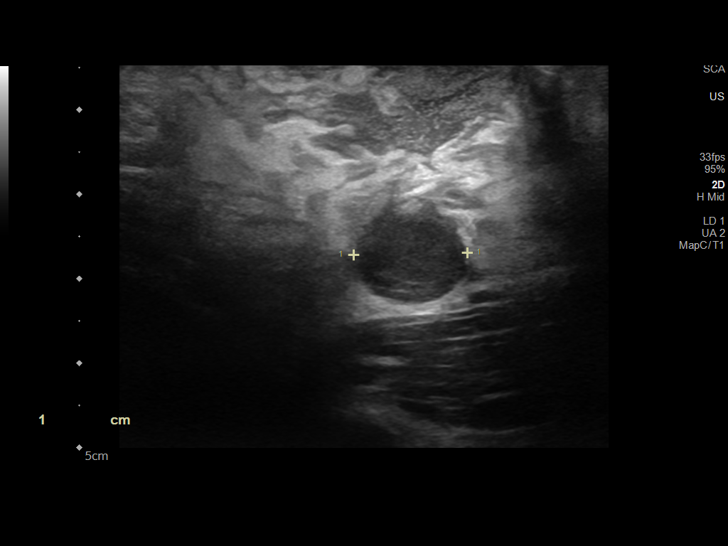
[im 11/27]
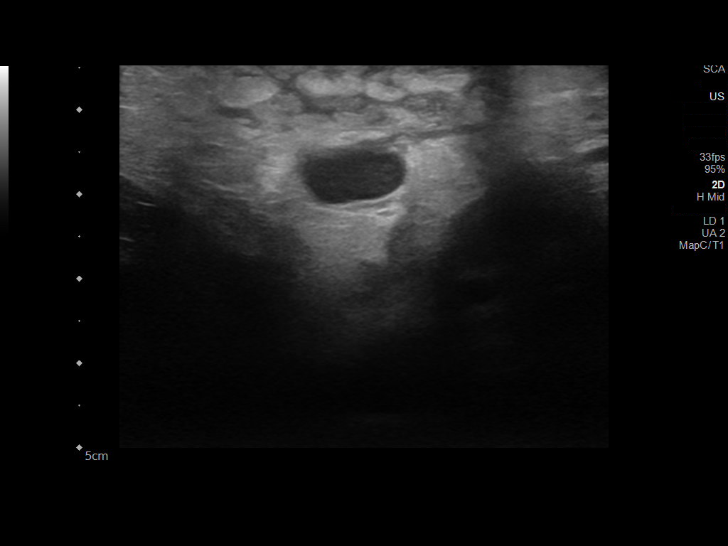
[im 14/27]
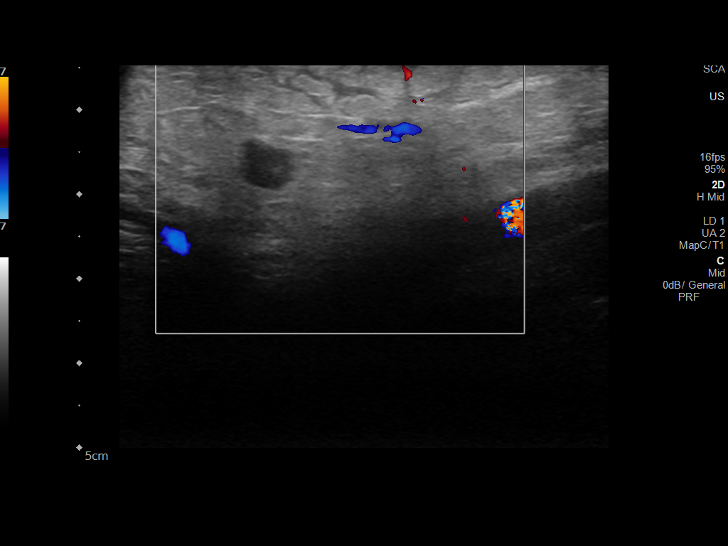
[im 16/27]
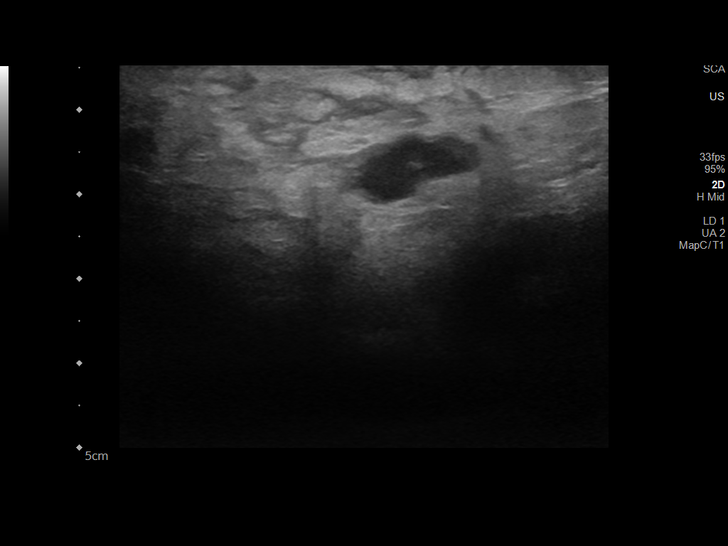
[im 18/27]
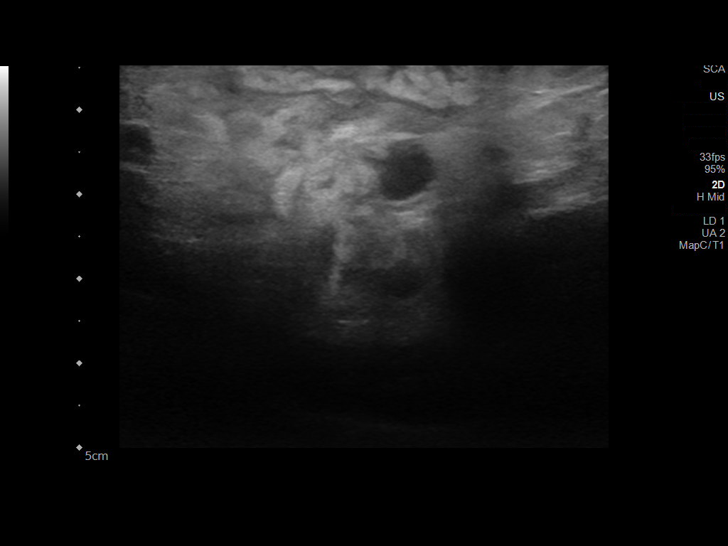
[im 20/27]
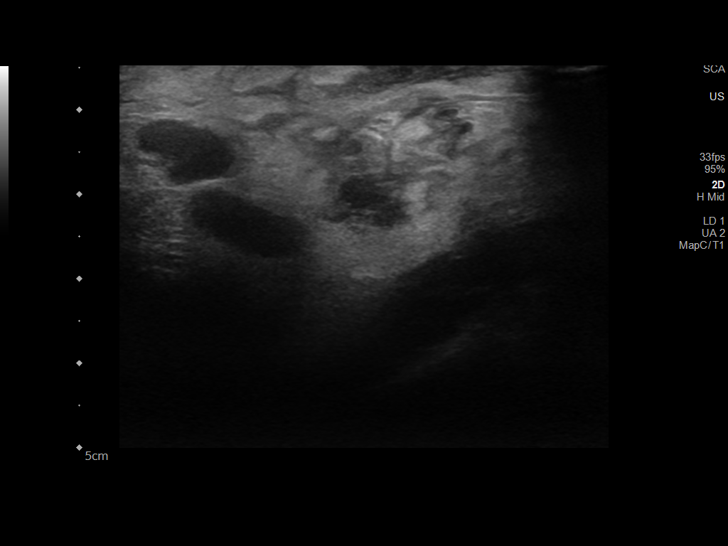
[im 22/27]
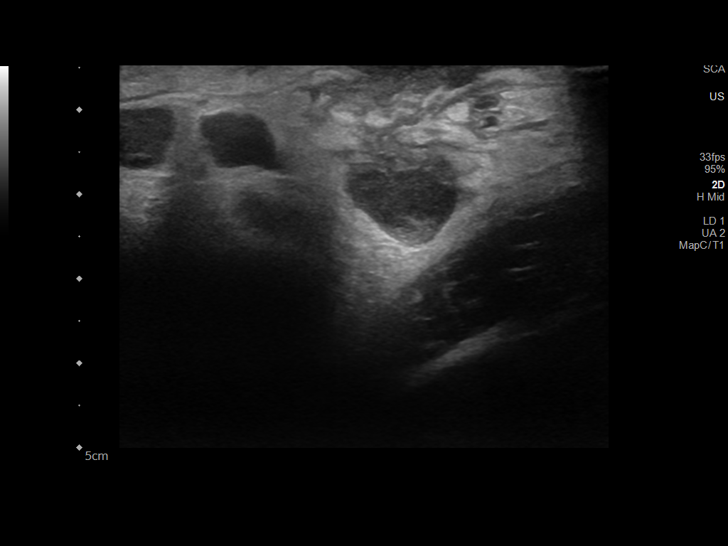
[im 24/27]
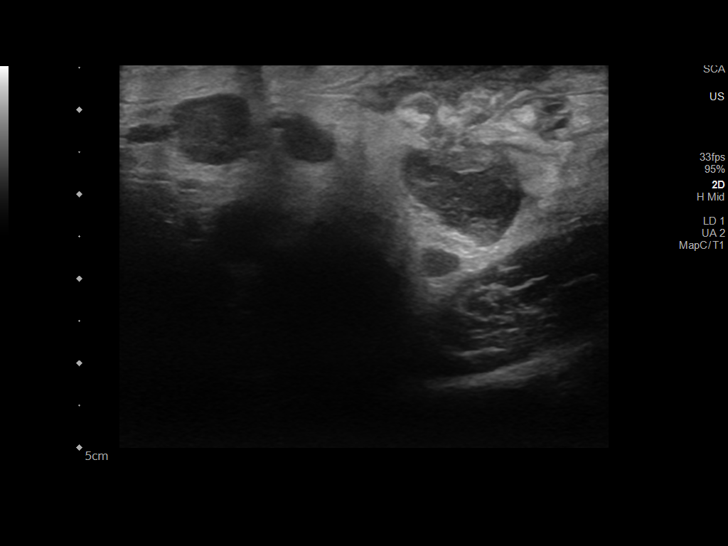
[im 27/27]
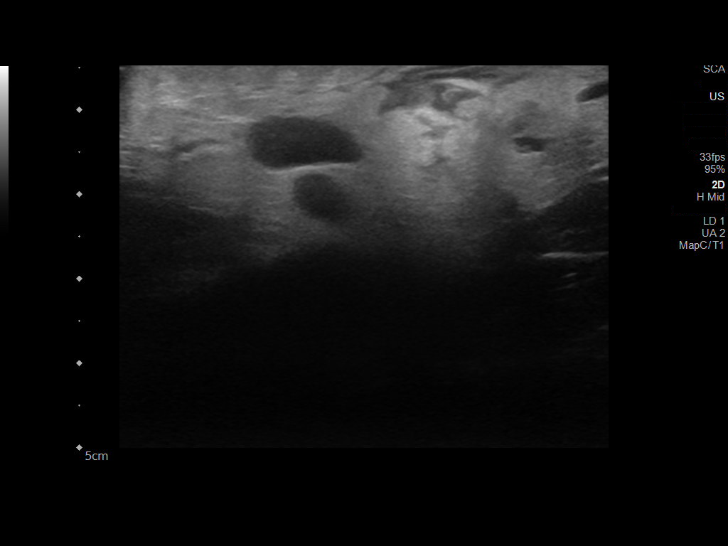

[13 of 25 positions shown; findings below may reference images not displayed]

FINDINGS: LEFT groin: Extensive edema in the subcutaneous fat.

(Image 3 of 23) hypoechoic area with ill-defined margins surrounded
by echogenic fat in the RIGHT groin suggestive of fluid with
internal debris measuring approximately 2.0 x 1.2 cm and as much as
1.6 cm craniocaudal.

Subjacent hypoechoic area measuring 1.3 x 1.2 cm. Surrounding
inflammatory changes measuring as much as 4.8 x 1.8 x 3.6 cm.

Hypoechoic ovoid groin lymph nodes surround this location. There is
some indistinct appearance of musculature, likely ileus psoas
musculature as well.

RIGHT groin with some edema in the subcutaneous fat. Multiple
enlarged hypoechoic lymph nodes.
IMPRESSION: Findings of potential extensive infection or inflammation in the
groin with suspected complex fluid/early abscess surrounded by edema
in the subcutaneous fat and multiple enlarged lymph nodes in the
LEFT groin.

RIGHT groin also with inflammatory changes and potential suppurative
adenitis. Note that it would be difficult to distinguish lymph nodes
of a reactive nature from lymph nodes associated with neoplasm.

Potential inflammation of underlying musculature in the RIGHT groin.
Given the extensive nature of above findings would suggest CT of the
pelvis for further evaluation utilizing intravenous contrast.

## 2023-04-16 IMAGING — CT CT PELVIS W/ CM
1 series · 14 of 32 positions shown, 17 images · IV contrast (APPLIED)
Comparison: Ultrasound evaluation March 02, 2021.
COMPARISON: Ultrasound evaluation March 02, 2021.

Addendum:
CLINICAL DATA: Inguinal lymphadenopathy in a 24-year-old female.

EXAM:
CT PELVIS WITH CONTRAST
TECHNIQUE: Multidetector CT imaging of the pelvis was performed using the
standard protocol following the bolus administration of intravenous
contrast.
CONTRAST:  80mL EMZGM7-FMD IOPAMIDOL (EMZGM7-FMD) INJECTION 76%

[Series 2: routine pelvis w/cm · axial · 0.75mm/px · z∈[-275,-20]mm · 14 of 57 slices shown, 17 images]
[im 4/57  soft-tissue]
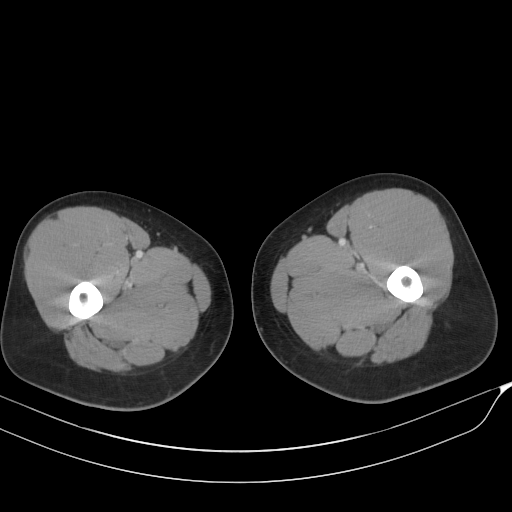
[im 4/57  bone]
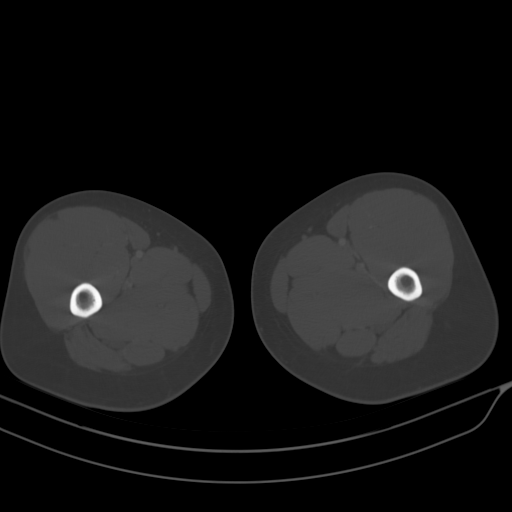
[im 10/57  soft-tissue]
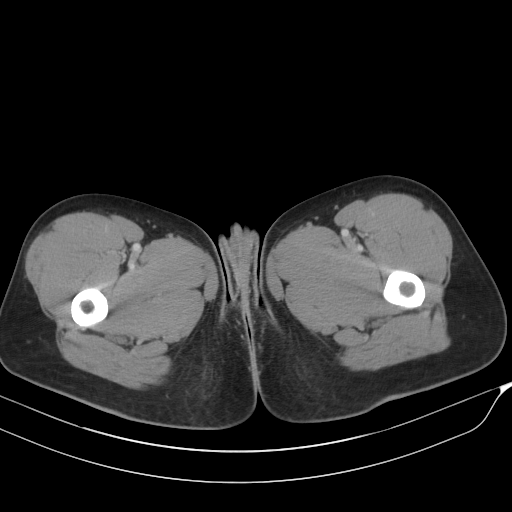
[im 13/57  soft-tissue]
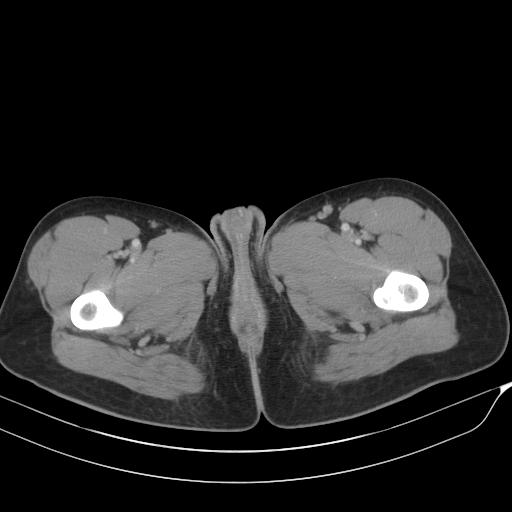
[im 19/57  soft-tissue]
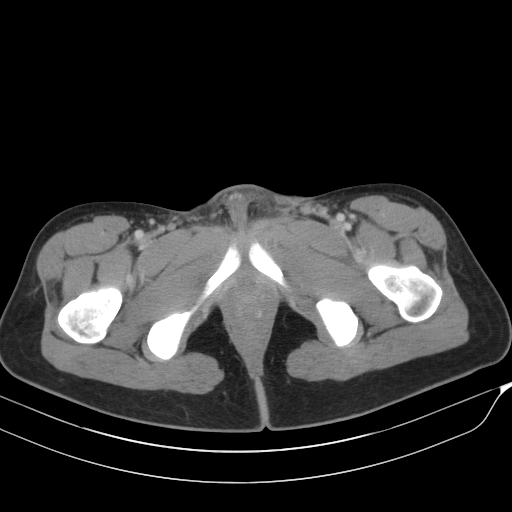
[im 24/57  soft-tissue]
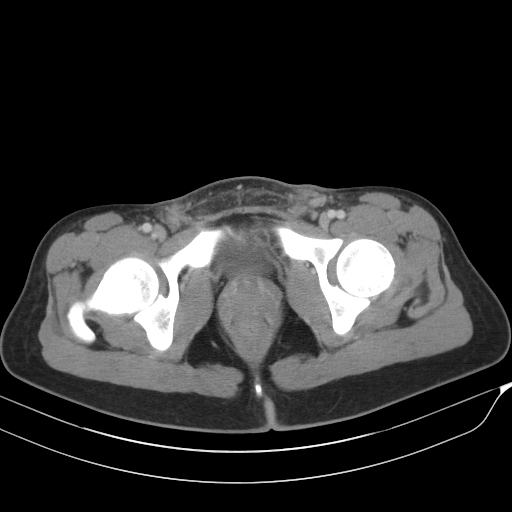
[im 29/57  soft-tissue]
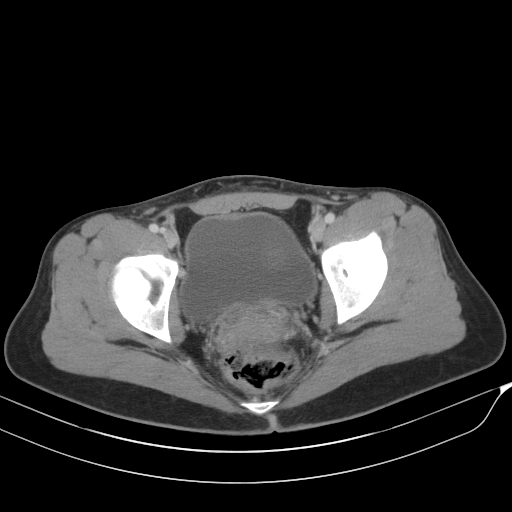
[im 33/57  soft-tissue]
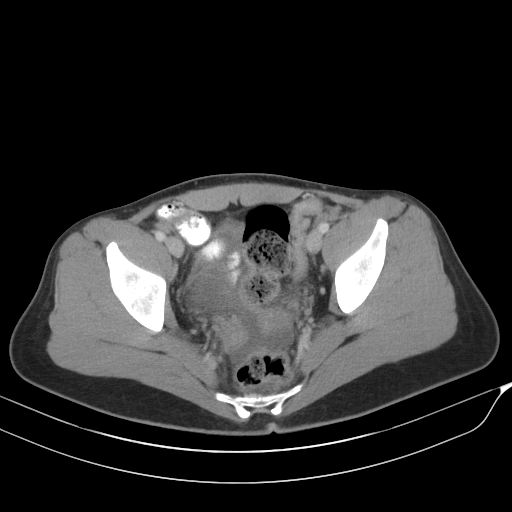
[im 38/57  soft-tissue]
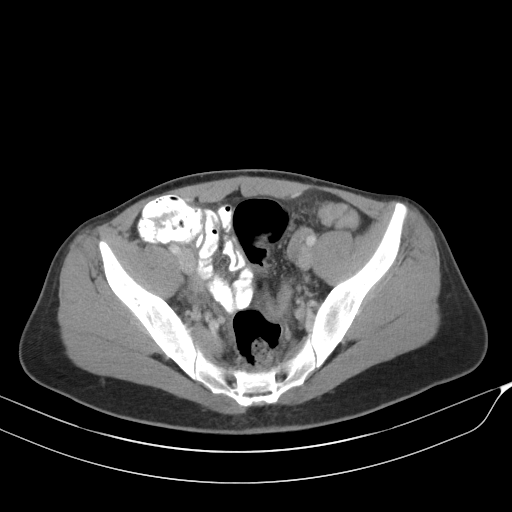
[im 44/57  soft-tissue]
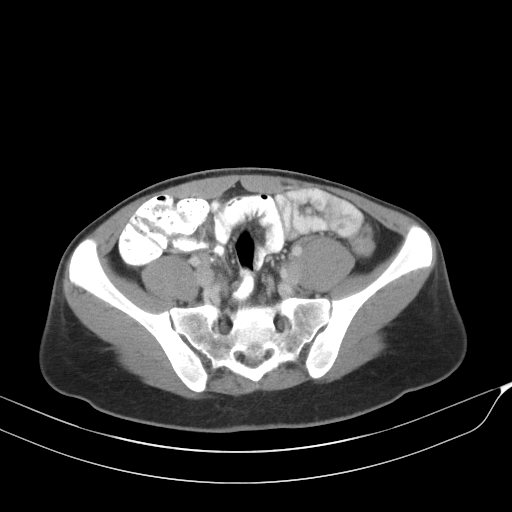
[im 44/57  bone]
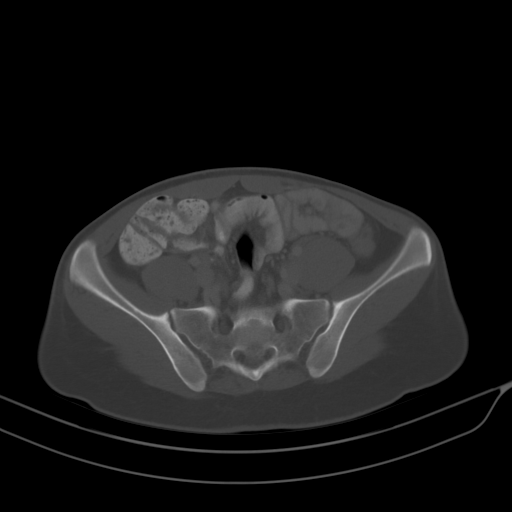
[im 47/57  soft-tissue]
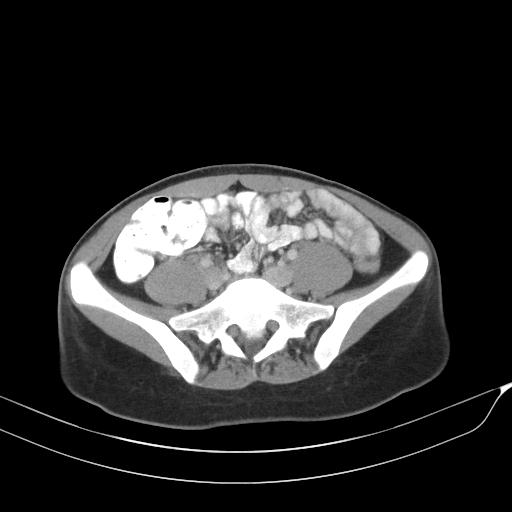
[im 49/57  lung]
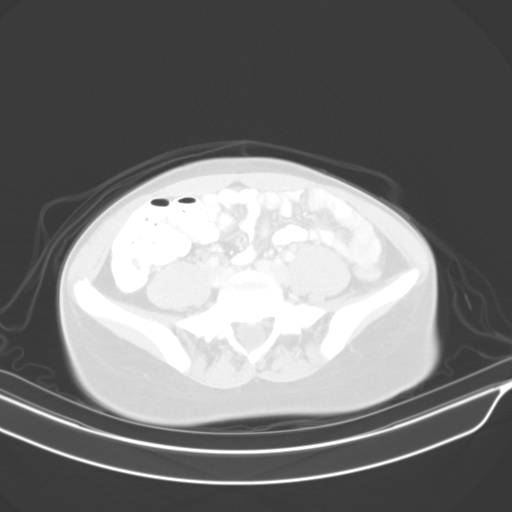
[im 51/57  lung]
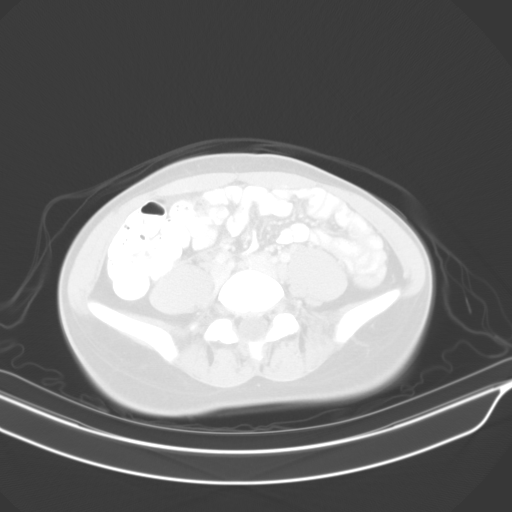
[im 53/57  soft-tissue]
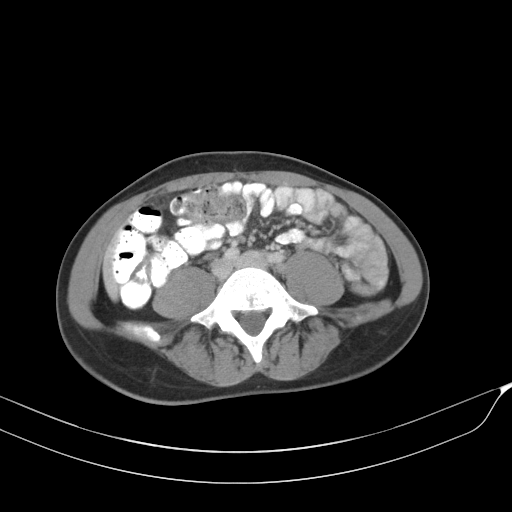
[im 53/57  lung]
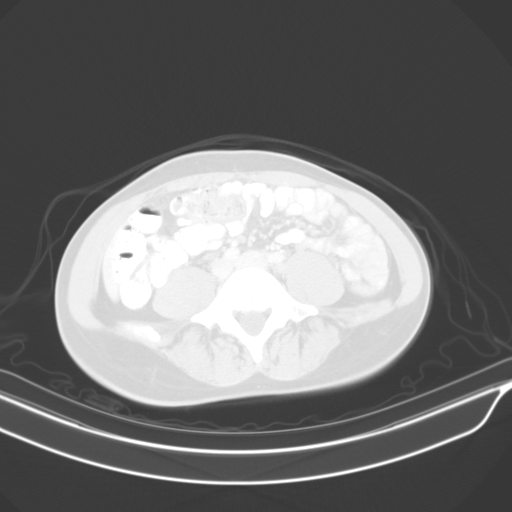
[im 55/57  lung]
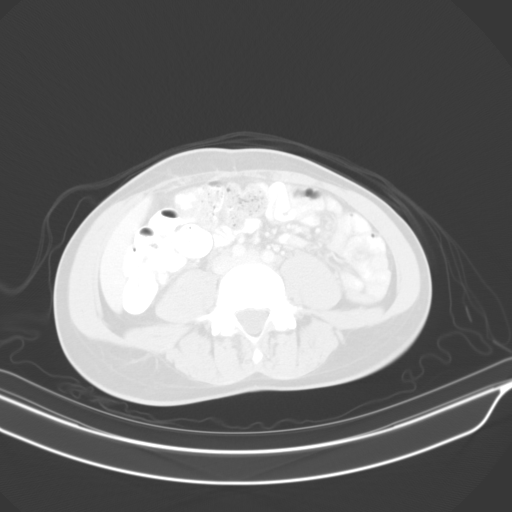

[14 of 32 positions shown; findings below may reference images not displayed]

FINDINGS: Urinary Tract: Urinary bladder is under distended limiting
assessment but with smooth contours, no signs of adjacent
inflammation

Bowel:  Visible bowel in the pelvis without acute process.

Vascular/Lymphatic: No pathologically enlarged lymph nodes in the
pelvis. Prominent nodes in the LEFT and RIGHT groin. Patent pelvic
vessels.

Reproductive: Post hysterectomy. LEFT ovary anterior to iliac
vasculature. RIGHT ovary along the RIGHT pelvic sidewall.

Some stranding in the LEFT groin with suggestion of small fluid
collection and or fat necrosis in this area (image 36/2) 1.6 x
cm. Small free fluid in the pelvis.

Other:  None

Musculoskeletal: No acute bone finding or destructive bone process.
RIGHT femoral neck with 2 x 1.3 cm area of lucency showing dense
peripheral sclerosis and well defined, narrow zone of transition.
Likely liposclerosing myxofibrous tumor of bone. No aggressive
features.
IMPRESSION: Some stranding in the LEFT groin with suggestion of small fluid
collection and or fat necrosis in this area 1.6 x 0.9 cm. Findings
are likely improved compared to ultrasound that was acquired in
early [REDACTED].

Small free fluid in the pelvis. Nonspecific, may relate to ongoing
inflammation or ovaries which remain in place.

RIGHT femoral neck with 2 x 1.3 cm area of lucency showing dense
peripheral sclerosis and well defined, narrow zone of transition.
Likely liposclerosing myxofibrous tumor of bone. No aggressive
features but based on location can be a risk for pathologic
fracture.

ADDENDUM:
Also mild stranding in the suprapubic region and in the RIGHT groin
likely improved based on comparison with previous ultrasound.

Additionally, preferred term for RIGHT femoral lesion would be
benign fibro-osseous lesion.

*** End of Addendum ***
FINDINGS: Urinary Tract: Urinary bladder is under distended limiting
assessment but with smooth contours, no signs of adjacent
inflammation

Bowel:  Visible bowel in the pelvis without acute process.

Vascular/Lymphatic: No pathologically enlarged lymph nodes in the
pelvis. Prominent nodes in the LEFT and RIGHT groin. Patent pelvic
vessels.

Reproductive: Post hysterectomy. LEFT ovary anterior to iliac
vasculature. RIGHT ovary along the RIGHT pelvic sidewall.

Some stranding in the LEFT groin with suggestion of small fluid
collection and or fat necrosis in this area (image 36/2) 1.6 x
cm. Small free fluid in the pelvis.

Other:  None

Musculoskeletal: No acute bone finding or destructive bone process.
RIGHT femoral neck with 2 x 1.3 cm area of lucency showing dense
peripheral sclerosis and well defined, narrow zone of transition.
Likely liposclerosing myxofibrous tumor of bone. No aggressive
features.
IMPRESSION: Some stranding in the LEFT groin with suggestion of small fluid
collection and or fat necrosis in this area 1.6 x 0.9 cm. Findings
are likely improved compared to ultrasound that was acquired in
early [REDACTED].

Small free fluid in the pelvis. Nonspecific, may relate to ongoing
inflammation or ovaries which remain in place.

RIGHT femoral neck with 2 x 1.3 cm area of lucency showing dense
peripheral sclerosis and well defined, narrow zone of transition.
Likely liposclerosing myxofibrous tumor of bone. No aggressive
features but based on location can be a risk for pathologic
fracture.

## 2023-07-25 ENCOUNTER — Ambulatory Visit: Payer: 59 | Admitting: Family Medicine
